# Patient Record
Sex: Female | Born: 1960 | Race: White | Hispanic: No | Marital: Married | Smoking: Never smoker
Health system: Southern US, Community
[De-identification: ages and names within clinical notes are randomized; demographics above are authoritative.]

## PROBLEM LIST (undated history)

## (undated) DIAGNOSIS — I10 Essential (primary) hypertension: Secondary | ICD-10-CM

## (undated) HISTORY — PX: ROTATOR CUFF REPAIR: SHX139

## (undated) HISTORY — PX: REDUCTION MAMMAPLASTY: SUR839

---

## 1998-06-18 ENCOUNTER — Other Ambulatory Visit: Admission: RE | Admit: 1998-06-18 | Discharge: 1998-06-18 | Payer: Self-pay | Admitting: Gynecology

## 1999-08-18 ENCOUNTER — Other Ambulatory Visit: Admission: RE | Admit: 1999-08-18 | Discharge: 1999-08-18 | Payer: Self-pay | Admitting: Gynecology

## 2000-12-09 ENCOUNTER — Other Ambulatory Visit: Admission: RE | Admit: 2000-12-09 | Discharge: 2000-12-09 | Payer: Self-pay | Admitting: Gynecology

## 2002-12-13 ENCOUNTER — Other Ambulatory Visit: Admission: RE | Admit: 2002-12-13 | Discharge: 2002-12-13 | Payer: Self-pay | Admitting: Gynecology

## 2004-04-16 ENCOUNTER — Other Ambulatory Visit: Admission: RE | Admit: 2004-04-16 | Discharge: 2004-04-16 | Payer: Self-pay | Admitting: Gynecology

## 2005-07-08 ENCOUNTER — Other Ambulatory Visit: Admission: RE | Admit: 2005-07-08 | Discharge: 2005-07-08 | Payer: Self-pay | Admitting: Gynecology

## 2006-11-02 ENCOUNTER — Other Ambulatory Visit: Admission: RE | Admit: 2006-11-02 | Discharge: 2006-11-02 | Payer: Self-pay | Admitting: Gynecology

## 2009-08-12 ENCOUNTER — Encounter: Payer: Self-pay | Admitting: Pulmonary Disease

## 2009-09-03 ENCOUNTER — Encounter: Admission: RE | Admit: 2009-09-03 | Discharge: 2009-09-03 | Payer: Self-pay | Admitting: Otolaryngology

## 2010-08-03 ENCOUNTER — Emergency Department (HOSPITAL_BASED_OUTPATIENT_CLINIC_OR_DEPARTMENT_OTHER): Admission: EM | Admit: 2010-08-03 | Discharge: 2010-08-03 | Payer: Self-pay | Admitting: Emergency Medicine

## 2014-01-26 ENCOUNTER — Ambulatory Visit: Payer: Self-pay

## 2014-08-20 ENCOUNTER — Other Ambulatory Visit: Payer: Self-pay | Admitting: Otolaryngology

## 2014-08-20 DIAGNOSIS — R51 Headache: Principal | ICD-10-CM

## 2014-08-20 DIAGNOSIS — R519 Headache, unspecified: Secondary | ICD-10-CM

## 2014-08-30 ENCOUNTER — Ambulatory Visit
Admission: RE | Admit: 2014-08-30 | Discharge: 2014-08-30 | Disposition: A | Discharge status: Home / Self Care | Payer: BC Managed Care – PPO | Source: Ambulatory Visit | Attending: Otolaryngology | Admitting: Otolaryngology

## 2014-08-30 DIAGNOSIS — R51 Headache: Principal | ICD-10-CM

## 2014-08-30 DIAGNOSIS — R519 Headache, unspecified: Secondary | ICD-10-CM

## 2014-08-30 MED ORDER — GADOBENATE DIMEGLUMINE 529 MG/ML IV SOLN
15.0000 mL | Freq: Once | INTRAVENOUS | Status: AC | PRN
  Administered 2014-08-30: 15 mL via INTRAVENOUS

## 2016-03-20 ENCOUNTER — Other Ambulatory Visit: Payer: Self-pay | Admitting: Orthopedic Surgery

## 2016-03-20 DIAGNOSIS — R52 Pain, unspecified: Secondary | ICD-10-CM

## 2016-03-20 DIAGNOSIS — R531 Weakness: Secondary | ICD-10-CM

## 2016-03-24 ENCOUNTER — Ambulatory Visit
Admission: RE | Admit: 2016-03-24 | Discharge: 2016-03-24 | Disposition: A | Discharge status: Home / Self Care | Payer: BLUE CROSS/BLUE SHIELD | Source: Ambulatory Visit | Attending: Orthopedic Surgery | Admitting: Orthopedic Surgery

## 2016-03-24 DIAGNOSIS — R52 Pain, unspecified: Secondary | ICD-10-CM

## 2016-03-24 DIAGNOSIS — R531 Weakness: Secondary | ICD-10-CM

## 2016-03-26 DIAGNOSIS — G8929 Other chronic pain: Secondary | ICD-10-CM | POA: Insufficient documentation

## 2016-03-26 DIAGNOSIS — M25562 Pain in left knee: Secondary | ICD-10-CM

## 2016-03-26 DIAGNOSIS — M25561 Pain in right knee: Secondary | ICD-10-CM

## 2016-06-23 DIAGNOSIS — Z9889 Other specified postprocedural states: Secondary | ICD-10-CM | POA: Insufficient documentation

## 2016-07-28 ENCOUNTER — Other Ambulatory Visit: Payer: Self-pay | Admitting: Gastroenterology

## 2016-07-28 DIAGNOSIS — R1033 Periumbilical pain: Secondary | ICD-10-CM

## 2016-07-31 ENCOUNTER — Ambulatory Visit
Admission: RE | Admit: 2016-07-31 | Discharge: 2016-07-31 | Disposition: A | Discharge status: Home / Self Care | Payer: BLUE CROSS/BLUE SHIELD | Source: Ambulatory Visit | Attending: Gastroenterology | Admitting: Gastroenterology

## 2016-07-31 DIAGNOSIS — R1033 Periumbilical pain: Secondary | ICD-10-CM

## 2016-07-31 MED ORDER — IOPAMIDOL (ISOVUE-300) INJECTION 61%
100.0000 mL | Freq: Once | INTRAVENOUS | Status: AC | PRN
  Administered 2016-07-31: 100 mL via INTRAVENOUS

## 2017-01-14 DIAGNOSIS — J0100 Acute maxillary sinusitis, unspecified: Secondary | ICD-10-CM | POA: Diagnosis not present

## 2017-02-02 DIAGNOSIS — R05 Cough: Secondary | ICD-10-CM | POA: Diagnosis not present

## 2017-03-11 DIAGNOSIS — M2042 Other hammer toe(s) (acquired), left foot: Secondary | ICD-10-CM | POA: Diagnosis not present

## 2017-03-11 DIAGNOSIS — M2040 Other hammer toe(s) (acquired), unspecified foot: Secondary | ICD-10-CM | POA: Diagnosis not present

## 2017-03-11 DIAGNOSIS — M2041 Other hammer toe(s) (acquired), right foot: Secondary | ICD-10-CM | POA: Diagnosis not present

## 2017-03-11 DIAGNOSIS — M19071 Primary osteoarthritis, right ankle and foot: Secondary | ICD-10-CM | POA: Diagnosis not present

## 2017-03-11 DIAGNOSIS — G5762 Lesion of plantar nerve, left lower limb: Secondary | ICD-10-CM | POA: Diagnosis not present

## 2017-08-17 ENCOUNTER — Encounter: Payer: Self-pay | Admitting: Endocrinology

## 2017-08-17 DIAGNOSIS — Z1389 Encounter for screening for other disorder: Secondary | ICD-10-CM | POA: Diagnosis not present

## 2017-08-17 DIAGNOSIS — E559 Vitamin D deficiency, unspecified: Secondary | ICD-10-CM | POA: Diagnosis not present

## 2017-08-17 DIAGNOSIS — Z0000 Encounter for general adult medical examination without abnormal findings: Secondary | ICD-10-CM | POA: Diagnosis not present

## 2017-08-17 DIAGNOSIS — I10 Essential (primary) hypertension: Secondary | ICD-10-CM | POA: Diagnosis not present

## 2017-08-17 DIAGNOSIS — E7849 Other hyperlipidemia: Secondary | ICD-10-CM | POA: Diagnosis not present

## 2017-09-30 DIAGNOSIS — Z6832 Body mass index (BMI) 32.0-32.9, adult: Secondary | ICD-10-CM | POA: Diagnosis not present

## 2017-09-30 DIAGNOSIS — I10 Essential (primary) hypertension: Secondary | ICD-10-CM | POA: Diagnosis not present

## 2017-09-30 DIAGNOSIS — J45998 Other asthma: Secondary | ICD-10-CM | POA: Diagnosis not present

## 2017-09-30 DIAGNOSIS — R05 Cough: Secondary | ICD-10-CM | POA: Diagnosis not present

## 2017-11-08 DIAGNOSIS — L700 Acne vulgaris: Secondary | ICD-10-CM | POA: Diagnosis not present

## 2017-11-08 DIAGNOSIS — L218 Other seborrheic dermatitis: Secondary | ICD-10-CM | POA: Diagnosis not present

## 2017-11-08 DIAGNOSIS — L820 Inflamed seborrheic keratosis: Secondary | ICD-10-CM | POA: Diagnosis not present

## 2018-01-10 DIAGNOSIS — E669 Obesity, unspecified: Secondary | ICD-10-CM | POA: Diagnosis not present

## 2018-01-10 DIAGNOSIS — Z6833 Body mass index (BMI) 33.0-33.9, adult: Secondary | ICD-10-CM | POA: Diagnosis not present

## 2018-02-16 DIAGNOSIS — Z1231 Encounter for screening mammogram for malignant neoplasm of breast: Secondary | ICD-10-CM | POA: Diagnosis not present

## 2018-02-16 DIAGNOSIS — Z13820 Encounter for screening for osteoporosis: Secondary | ICD-10-CM | POA: Diagnosis not present

## 2018-02-16 DIAGNOSIS — Z6831 Body mass index (BMI) 31.0-31.9, adult: Secondary | ICD-10-CM | POA: Diagnosis not present

## 2018-02-16 DIAGNOSIS — Z808 Family history of malignant neoplasm of other organs or systems: Secondary | ICD-10-CM | POA: Diagnosis not present

## 2018-02-16 DIAGNOSIS — Z01419 Encounter for gynecological examination (general) (routine) without abnormal findings: Secondary | ICD-10-CM | POA: Diagnosis not present

## 2018-02-16 DIAGNOSIS — Z800 Family history of malignant neoplasm of digestive organs: Secondary | ICD-10-CM | POA: Diagnosis not present

## 2018-04-11 DIAGNOSIS — Z809 Family history of malignant neoplasm, unspecified: Secondary | ICD-10-CM | POA: Diagnosis not present

## 2018-07-14 DIAGNOSIS — G8929 Other chronic pain: Secondary | ICD-10-CM | POA: Diagnosis not present

## 2018-07-14 DIAGNOSIS — M172 Bilateral post-traumatic osteoarthritis of knee: Secondary | ICD-10-CM | POA: Diagnosis not present

## 2018-07-25 DIAGNOSIS — M172 Bilateral post-traumatic osteoarthritis of knee: Secondary | ICD-10-CM | POA: Diagnosis not present

## 2018-07-25 DIAGNOSIS — G8929 Other chronic pain: Secondary | ICD-10-CM | POA: Diagnosis not present

## 2018-11-07 DIAGNOSIS — E7849 Other hyperlipidemia: Secondary | ICD-10-CM | POA: Diagnosis not present

## 2018-11-07 DIAGNOSIS — J45998 Other asthma: Secondary | ICD-10-CM | POA: Diagnosis not present

## 2018-11-07 DIAGNOSIS — E559 Vitamin D deficiency, unspecified: Secondary | ICD-10-CM | POA: Diagnosis not present

## 2018-11-07 DIAGNOSIS — G4700 Insomnia, unspecified: Secondary | ICD-10-CM | POA: Diagnosis not present

## 2018-11-07 DIAGNOSIS — M25561 Pain in right knee: Secondary | ICD-10-CM | POA: Diagnosis not present

## 2018-11-07 DIAGNOSIS — I10 Essential (primary) hypertension: Secondary | ICD-10-CM | POA: Diagnosis not present

## 2019-03-09 DIAGNOSIS — M5416 Radiculopathy, lumbar region: Secondary | ICD-10-CM | POA: Diagnosis not present

## 2019-03-09 DIAGNOSIS — M5412 Radiculopathy, cervical region: Secondary | ICD-10-CM | POA: Diagnosis not present

## 2019-03-09 DIAGNOSIS — M545 Low back pain: Secondary | ICD-10-CM | POA: Diagnosis not present

## 2019-03-09 DIAGNOSIS — M542 Cervicalgia: Secondary | ICD-10-CM | POA: Diagnosis not present

## 2019-03-10 DIAGNOSIS — M545 Low back pain: Secondary | ICD-10-CM | POA: Diagnosis not present

## 2019-03-10 DIAGNOSIS — M542 Cervicalgia: Secondary | ICD-10-CM | POA: Diagnosis not present

## 2019-03-23 ENCOUNTER — Telehealth: Payer: Self-pay | Admitting: Plastic Surgery

## 2019-03-23 NOTE — Telephone Encounter (Signed)

## 2019-03-24 ENCOUNTER — Encounter: Payer: Self-pay | Admitting: Plastic Surgery

## 2019-03-24 ENCOUNTER — Ambulatory Visit: Payer: BC Managed Care – PPO | Admitting: Plastic Surgery

## 2019-03-24 ENCOUNTER — Other Ambulatory Visit: Payer: Self-pay

## 2019-03-24 DIAGNOSIS — M542 Cervicalgia: Secondary | ICD-10-CM | POA: Insufficient documentation

## 2019-03-24 DIAGNOSIS — M546 Pain in thoracic spine: Secondary | ICD-10-CM

## 2019-03-24 DIAGNOSIS — N62 Hypertrophy of breast: Secondary | ICD-10-CM | POA: Diagnosis not present

## 2019-03-24 DIAGNOSIS — G8929 Other chronic pain: Secondary | ICD-10-CM | POA: Diagnosis not present

## 2019-03-24 DIAGNOSIS — M549 Dorsalgia, unspecified: Secondary | ICD-10-CM | POA: Insufficient documentation

## 2019-03-24 NOTE — Progress Notes (Signed)
Patient ID: Stephanie Hall, female    DOB: February 01, 1961, 57 y.o.   MRN: [id number]   Chief Complaint  Patient presents with  . Advice Only    for breast reduction    Mammary Hyperplasia: The patient is a 58 y.o. female with a history of mammary hyperplasia for several years.  She has extremely large breasts causing symptoms that include the following: Back pain (upper and lower) and neck pain. She frequently pins bra cups higher on straps for better lift and relief. Notices relief when holding breast up in her hands. Shoulder straps causing grooves, pain occasionally requiring padding. Pain medication is sometimes required with motrin and tylenol.  Activities that are hindered by enlarged breasts include: Golf, running and exercise.  Her breasts are extremely large and fairly symmetric.  She has hyperpigmentation of the inframammary area on both sides.  The sternal to nipple distance on the right is 26 cm and the left is 28 cm.  The IMF distance is 14 cm.  She is 5 feet 4 inches tall and weighs 186 pounds.  Preoperative bra size = 38 DDD cup.  The estimated excess breast tissue to be removed at the time of surgery = 450 grams on the left and 450 grams on the right.  The patient has had anesthesia or sedation in the past.   The patient has not had problems with anesthesia  She doesnot have a history of breast feeding. She does not have a family history of breast cancer.   Her other medical history is remarkable for hyperlipidemia. Her surgical history is remarkable for shoulder surgery and hysterectomy. She has had a previous mammogram.  She is due for a mammogram now and will go ahead and make arrangements to have it done.     Review of Systems  Constitutional: Positive for activity change. Negative for appetite change.  HENT: Negative.   Eyes: Negative.   Respiratory: Negative.  Negative for chest tightness and shortness of breath.   Cardiovascular: Negative.  Negative for leg  swelling.  Gastrointestinal: Negative.  Negative for abdominal pain.  Genitourinary: Negative.   Musculoskeletal: Positive for back pain and neck pain.  Neurological: Negative.   Hematological: Negative.   Psychiatric/Behavioral: Negative.     History reviewed. No pertinent past medical history.  History reviewed. No pertinent surgical history.    Current Outpatient Medications:  .  Diclofenac-miSOPROStol 75-0.2 MG TBEC, diclofenac 75 mg-misoprostol 200 mcg tablet,immediate,delayed release, Disp: , Rfl:  .  DULoxetine (CYMBALTA) 30 MG capsule, Take 30 mg by mouth daily., Disp: , Rfl:  .  methocarbamol (ROBAXIN) 500 MG tablet, As needed., Disp: , Rfl:  .  metoprolol succinate (TOPROL-XL) 50 MG 24 hr tablet, Take 1 tablet by mouth 2 (two) times daily., Disp: , Rfl:  .  rizatriptan (MAXALT-MLT) 10 MG disintegrating tablet, As needed., Disp: , Rfl:  .  traZODone (DESYREL) 50 MG tablet, trazodone 50 mg tablet, Disp: , Rfl:    Objective:   Vitals:   03/24/19 1537  BP: (!) 156/85  Pulse: 76  Temp: 98.2 F (36.8 C)  SpO2: 95%    Physical Exam Vitals signs and nursing note reviewed.  Constitutional:      Appearance: Normal appearance.  HENT:     Head: Normocephalic and atraumatic.     Nose: Nose normal.     Mouth/Throat:     Mouth: Mucous membranes are moist.  Eyes:     Pupils: Pupils are equal, round, and reactive  to light.  Neck:     Musculoskeletal: Muscular tenderness present.  Cardiovascular:     Rate and Rhythm: Normal rate.     Pulses: Normal pulses.  Abdominal:     General: Abdomen is flat. There is no distension.     Tenderness: There is no abdominal tenderness.  Musculoskeletal:        General: No swelling or deformity.  Skin:    General: Skin is warm.     Capillary Refill: Capillary refill takes less than 2 seconds.  Neurological:     General: No focal deficit present.     Mental Status: She is alert and oriented to person, place, and time.  Psychiatric:         Mood and Affect: Mood normal.        Behavior: Behavior normal.        Thought Content: Thought content normal.     Assessment & Plan:     ICD-10-CM   1. Neck pain  M54.2   2. Chronic bilateral thoracic back pain  M54.6    G89.29   3. Symptomatic mammary hypertrophy  N62     Recommend bilateral breast reduction with lateral liposuction.  Pictures taken with patient permission and placed in the chart.  Alena Billslaire S Manasvi Dickard, DO

## 2019-03-27 DIAGNOSIS — R0989 Other specified symptoms and signs involving the circulatory and respiratory systems: Secondary | ICD-10-CM | POA: Diagnosis not present

## 2019-03-27 DIAGNOSIS — K219 Gastro-esophageal reflux disease without esophagitis: Secondary | ICD-10-CM | POA: Insufficient documentation

## 2019-03-27 DIAGNOSIS — R198 Other specified symptoms and signs involving the digestive system and abdomen: Secondary | ICD-10-CM | POA: Insufficient documentation

## 2019-03-27 DIAGNOSIS — H9313 Tinnitus, bilateral: Secondary | ICD-10-CM | POA: Insufficient documentation

## 2019-03-27 DIAGNOSIS — R490 Dysphonia: Secondary | ICD-10-CM | POA: Diagnosis not present

## 2019-04-07 DIAGNOSIS — M542 Cervicalgia: Secondary | ICD-10-CM | POA: Diagnosis not present

## 2019-04-07 DIAGNOSIS — M5417 Radiculopathy, lumbosacral region: Secondary | ICD-10-CM | POA: Diagnosis not present

## 2019-04-07 DIAGNOSIS — M5412 Radiculopathy, cervical region: Secondary | ICD-10-CM | POA: Diagnosis not present

## 2019-04-07 DIAGNOSIS — M4725 Other spondylosis with radiculopathy, thoracolumbar region: Secondary | ICD-10-CM | POA: Diagnosis not present

## 2019-04-13 DIAGNOSIS — Z1231 Encounter for screening mammogram for malignant neoplasm of breast: Secondary | ICD-10-CM | POA: Diagnosis not present

## 2019-05-11 DIAGNOSIS — G8929 Other chronic pain: Secondary | ICD-10-CM | POA: Diagnosis not present

## 2019-05-11 DIAGNOSIS — M25512 Pain in left shoulder: Secondary | ICD-10-CM | POA: Diagnosis not present

## 2019-05-23 ENCOUNTER — Other Ambulatory Visit: Payer: Self-pay

## 2019-05-23 ENCOUNTER — Encounter: Payer: Self-pay | Admitting: Surgical

## 2019-05-23 ENCOUNTER — Ambulatory Visit (INDEPENDENT_AMBULATORY_CARE_PROVIDER_SITE_OTHER): Payer: BC Managed Care – PPO | Admitting: Surgical

## 2019-05-23 VITALS — BP 128/81 | HR 68 | Temp 97.1°F | Ht 65.0 in | Wt 183.0 lb

## 2019-05-23 DIAGNOSIS — N62 Hypertrophy of breast: Secondary | ICD-10-CM

## 2019-05-23 DIAGNOSIS — M546 Pain in thoracic spine: Secondary | ICD-10-CM

## 2019-05-23 DIAGNOSIS — M542 Cervicalgia: Secondary | ICD-10-CM

## 2019-05-23 DIAGNOSIS — G8929 Other chronic pain: Secondary | ICD-10-CM

## 2019-05-23 MED ORDER — HYDROCODONE-ACETAMINOPHEN 5-325 MG PO TABS: 1.0000 | ORAL_TABLET | Freq: Four times a day (QID) | ORAL | 0 refills | Status: AC | PRN

## 2019-05-23 MED ORDER — ONDANSETRON HCL 4 MG PO TABS: 4.0000 mg | ORAL_TABLET | Freq: Three times a day (TID) | ORAL | 0 refills | Status: DC | PRN

## 2019-05-23 MED ORDER — CEPHALEXIN 500 MG PO CAPS: 500.0000 mg | ORAL_CAPSULE | Freq: Two times a day (BID) | ORAL | 0 refills | Status: AC

## 2019-05-23 NOTE — Progress Notes (Signed)
Patient ID: Stephanie Hall, female    DOB: 04-06-1961, 58 y.o.   MRN: [id number]  Chief Complaint  Patient presents with  . Pre-op Exam    for (B) breast reduction with lipo      ICD-10-CM   1. Symptomatic mammary hypertrophy  N62   2. Chronic bilateral thoracic back pain  M54.6    G89.29   3. Neck pain  M54.2      History of Present Illness: Stephanie Hall is a 58 y.o.  female  with a history of mammary hypertrophy.  She presents for preoperative evaluation for upcoming procedure, bilateral breast reduction with liposuction and possible liposuction of the abdomen and flanks, scheduled for 06/06/19 with Dr. Ulice [facility].  The sternal to nipple distance on the right is 26 cm and the left is 28 cm.  The IMF distance is 14 cm.  She is 5 feet 4 inches tall and weighs 183 pounds.  Preoperative bra size = 38 DDD cup.  The estimated excess breast tissue to be removed at the time of surgery = 450 grams on the left and 450 grams on the right.   She would like to be smaller than expected. She would like to be approximately a C cup.  The patient has not had problems with anesthesia. Caprini score of 4. No hx of DVT/PE, no fmhx of breast cancer. Mammogram 2-3 weeks ago, patient reports was normal. Will request results from imaging center or have patient bring results.  No recent illnesses or colds. No fevers, chills. She does report some constipation that is chronic.  Past Medical History: Allergies: Allergies  Allergen Reactions  . Neosporin [Bacitracin-Polymyxin B] Hives and Rash    Current Medications:  Current Outpatient Medications:  .  B Complex Vitamins (VITAMIN B-COMPLEX) TABS, Take 1 tablet by mouth daily., Disp: , Rfl:  .  b complex vitamins tablet, Take 1 tablet by mouth daily., Disp: , Rfl:  .  Diclofenac-miSOPROStol 75-0.2 MG TBEC, diclofenac 75 mg-misoprostol 200 mcg tablet,immediate,delayed release, Disp: , Rfl:  .  DULoxetine (CYMBALTA) 30 MG capsule, Take 30 mg  by mouth daily., Disp: , Rfl:  .  methocarbamol (ROBAXIN) 500 MG tablet, As needed., Disp: , Rfl:  .  metoprolol succinate (TOPROL-XL) 50 MG 24 hr tablet, Take 1 tablet by mouth 2 (two) times daily., Disp: , Rfl:  .  omeprazole (PRILOSEC) 40 MG capsule, Take 1 capsule by mouth daily., Disp: , Rfl:  .  OVER THE COUNTER MEDICATION, Multivitamin-Take 1 table by mouth daily., Disp: , Rfl:  .  rizatriptan (MAXALT-MLT) 10 MG disintegrating tablet, As needed., Disp: , Rfl:  .  traZODone (DESYREL) 50 MG tablet, trazodone 50 mg tablet, Disp: , Rfl:   Past Medical Problems: No past medical history on file.  Past Surgical History: No past surgical history on file.  Social History: Social History   Socioeconomic History  . Marital status: Married    Spouse name: Not on file  . Number of children: Not on file  . Years of education: Not on file  . Highest education level: Not on file  Occupational History  . Not on file  Social Needs  . Financial resource strain: Not on file  . Food insecurity    Worry: Not on file    Inability: Not on file  . Transportation needs    Medical: Not on file    Non-medical: Not on file  Tobacco Use  . Smoking status: Never Smoker  .  Smokeless tobacco: Never Used  Substance and Sexual Activity  . Alcohol use: Not on file  . Drug use: Not on file  . Sexual activity: Not on file  Lifestyle  . Physical activity    Days per week: Not on file    Minutes per session: Not on file  . Stress: Not on file  Relationships  . Social Musicianconnections    Talks on phone: Not on file    Gets together: Not on file    Attends religious service: Not on file    Active member of club or organization: Not on file    Attends meetings of clubs or organizations: Not on file    Relationship status: Not on file  . Intimate partner violence    Fear of current or ex partner: Not on file    Emotionally abused: Not on file    Physically abused: Not on file    Forced sexual  activity: Not on file  Other Topics Concern  . Not on file  Social History Narrative  . Not on file    Family History: No family history on file.  Review of Systems: Review of Systems  Constitutional: Negative for chills, fever, malaise/fatigue and weight loss.  Respiratory: Negative.   Cardiovascular: Negative.   Gastrointestinal: Negative.   Genitourinary: Negative.   Musculoskeletal: Positive for back pain, myalgias and neck pain.  Skin: Negative.   Neurological: Negative.     Physical Exam: Vital Signs BP 128/81 (BP Location: Left Arm, Patient Position: Sitting, Cuff Size: Normal)   Pulse 68   Temp (!) 97.1 F (36.2 C) (Temporal)   Ht 5\' 5"  (1.651 m)   Wt 183 lb (83 kg)   SpO2 96%   BMI 30.45 kg/m   Physical Exam Exam conducted with a chaperone present.  Constitutional:      General: She is not in acute distress.    Appearance: Normal appearance. She is not ill-appearing.  HENT:     Head: Normocephalic and atraumatic.  Eyes:     Pupils: Pupils are equal, round Neck:     Musculoskeletal: Normal range of motion.  Cardiovascular:     Rate and Rhythm: Normal rate and regular rhythm.     Pulses: Normal pulses.     Heart sounds: Normal heart sounds. No murmur.  Pulmonary:     Effort: Pulmonary effort is normal. No respiratory distress.     Breath sounds: Normal breath sounds. No wheezing.  Abdominal:     General: Abdomen is flat. There is no distension.     Palpations: Abdomen is soft.     Tenderness: There is no abdominal tenderness.  Musculoskeletal: Normal range of motion.  Skin:    General: Skin is warm and dry.     Findings: No erythema or rash.  Neurological:     General: No focal deficit present.     Mental Status: She is alert and oriented to person, place, and time. Mental status is at baseline.     Motor: No weakness.  Psychiatric:        Mood and Affect: Mood normal.        Behavior: Behavior normal.    Assessment/Plan: Stephanie Hall is  scheduled for bilateral breast reduction with lipo and possible liposuction of her abdomen and flanks with Dr. Ulice Boldillingham.  Risks, benefits, and alternatives of procedure discussed, questions answered and consent obtained.    The risk that can be encountered with breast reduction were discussed and include the  following but not limited to these:  Breast asymmetry, fluid accumulation, firmness of the breast, inability to breast feed, loss of nipple or areola, skin loss, decrease or no nipple sensation, fat necrosis of the breast tissue, bleeding, infection, healing delay.  There are risks of anesthesia, changes to skin sensation and injury to nerves or blood vessels.  The muscle can be temporarily or permanently injured.  You may have an allergic reaction to tape, suture, glue, blood products which can result in skin discoloration, swelling, pain, skin lesions, poor healing.  Any of these can lead to the need for revisonal surgery or stage procedures.  A reduction has potential to interfere with diagnostic procedures.  Nipple or breast piercing can increase risks of infection.  This procedure is best done when the breast is fully developed.  Changes in the breast will continue to occur over time.  Pregnancy can alter the outcomes of previous breast reduction surgery, weight gain and weigh loss can also effect the long term appearance.   The risks that can be encountered with and after liposuction were discussed and include the following but no limited to these:  Asymmetry, fluid accumulation, firmness of the area, fat necrosis with death of fat tissue, bleeding, infection, delayed healing, anesthesia risks, skin sensation changes, injury to structures including nerves, blood vessels, and muscles which may be temporary or permanent, allergies to tape, suture materials and glues, blood products, topical preparations or injected agents, skin and contour irregularities, skin discoloration and swelling, deep vein  thrombosis, cardiac and pulmonary complications, pain, which may persist, persistent pain, recurrence of the lesion, poor healing of the incision, possible need for revisional surgery or staged procedures. Thiere can also be persistent swelling, poor wound healing, rippling or loose skin, worsening of cellulite, swelling, and thermal burn or heat injury from ultrasound with the ultrasound-assisted lipoplasty technique. Any change in weight fluctuations can alter the outcome.  Caprini score of 4 - SCDs during surgery. Up and out of bed post-operatively. Medications sent to pharmacy.   Electronically signed by: Carola Rhine Monica Zahler, PA-C 05/23/2019 3:05 PM

## 2019-06-06 ENCOUNTER — Encounter: Payer: Self-pay | Admitting: Plastic Surgery

## 2019-06-06 DIAGNOSIS — N642 Atrophy of breast: Secondary | ICD-10-CM | POA: Diagnosis not present

## 2019-06-06 DIAGNOSIS — M542 Cervicalgia: Secondary | ICD-10-CM | POA: Diagnosis not present

## 2019-06-06 DIAGNOSIS — Z411 Encounter for cosmetic surgery: Secondary | ICD-10-CM | POA: Diagnosis not present

## 2019-06-06 DIAGNOSIS — E881 Lipodystrophy, not elsewhere classified: Secondary | ICD-10-CM | POA: Diagnosis not present

## 2019-06-06 DIAGNOSIS — Z719 Counseling, unspecified: Secondary | ICD-10-CM

## 2019-06-06 DIAGNOSIS — M549 Dorsalgia, unspecified: Secondary | ICD-10-CM | POA: Diagnosis not present

## 2019-06-06 DIAGNOSIS — N6012 Diffuse cystic mastopathy of left breast: Secondary | ICD-10-CM | POA: Diagnosis not present

## 2019-06-06 DIAGNOSIS — N62 Hypertrophy of breast: Secondary | ICD-10-CM | POA: Diagnosis not present

## 2019-06-06 MED ORDER — DIAZEPAM 2 MG PO TABS: 2.0000 mg | ORAL_TABLET | Freq: Three times a day (TID) | ORAL | 0 refills | Status: AC | PRN

## 2019-06-06 NOTE — Addendum Note (Signed)
Addended by: Wallace Going on: 06/06/2019 01:46 PM   Modules accepted: Orders

## 2019-06-13 ENCOUNTER — Other Ambulatory Visit: Payer: Self-pay

## 2019-06-13 ENCOUNTER — Ambulatory Visit (INDEPENDENT_AMBULATORY_CARE_PROVIDER_SITE_OTHER): Payer: BC Managed Care – PPO | Admitting: Surgical

## 2019-06-13 ENCOUNTER — Encounter: Payer: Self-pay | Admitting: Surgical

## 2019-06-13 ENCOUNTER — Encounter: Payer: BC Managed Care – PPO | Admitting: Surgical

## 2019-06-13 VITALS — BP 127/76 | HR 75 | Temp 97.8°F | Ht 65.0 in | Wt 179.8 lb

## 2019-06-13 DIAGNOSIS — M542 Cervicalgia: Secondary | ICD-10-CM

## 2019-06-13 DIAGNOSIS — G8929 Other chronic pain: Secondary | ICD-10-CM

## 2019-06-13 DIAGNOSIS — M546 Pain in thoracic spine: Secondary | ICD-10-CM

## 2019-06-13 DIAGNOSIS — N62 Hypertrophy of breast: Secondary | ICD-10-CM

## 2019-06-13 NOTE — Progress Notes (Signed)
   Subjective:     Patient ID: Stephanie Hall, female    DOB: April 02, 1961, 58 y.o.   MRN: [id number]  Chief Complaint  Patient presents with  . Post-op Follow-up    for (B) breast reduction with liposuction    HPI: The patient is a 58 y.o. female here for follow-up after bilateral breast reduction with liposuction of abdomen/flanks by Dr. Marla Roe at [facility] on 06/06/19. She is POD7.  Stephanie Hall is doing great. She has had minimal drain output and has been wearing a sports bra and her abdominal binder with topifoam. Her bruising has improved over the past week or so and she reports the pain is minimal and adequately controlled.  No fevers,chills,n/v. She is pleased with the size. Neck and back pain improving.  Review of Systems  Constitutional: Negative.   Respiratory: Negative.   Cardiovascular: Negative.   Gastrointestinal: Negative.   Genitourinary: Negative.   Musculoskeletal: Negative for back pain and neck pain.  Skin: Negative.        + bruising  Neurological: Negative.      Objective:   Vital Signs BP 127/76 (BP Location: Left Arm, Patient Position: Sitting, Cuff Size: Normal)   Pulse 75   Temp 97.8 F (36.6 C) (Temporal)   Ht 5\' 5"  (1.651 m)   Wt 179 lb 12.8 oz (81.6 kg)   SpO2 93%   BMI 29.92 kg/m  Vital Signs and Nursing Note Reviewed  Physical Exam  Constitutional: She is oriented to person, place, and time and well-developed, well-nourished, and in no distress. No distress.  HENT:  Head: Normocephalic and atraumatic.  Cardiovascular: Normal rate.  Pulmonary/Chest: Effort normal.  Musculoskeletal: Normal range of motion.  Neurological: She is alert and oriented to person, place, and time. Gait normal.  Skin: Skin is warm and dry. No rash noted. She is not diaphoretic. No erythema. No pallor.     Psychiatric: Mood and affect normal.      Assessment/Plan:     ICD-10-CM   1. Symptomatic mammary hypertrophy  N62   2. Neck pain  M54.2   3. Chronic  bilateral thoracic back pain  M54.6    G89.29     Stephanie Hall is doing great. She is pleased with the outcome and is healing nicely. She has not had any concerns, or fever/chills/n/v.  Continue wearing topifoam and abdominal binder for a few weeks to decrease swelling.  Continue wearing sports bra daily and at night.  Healthy diet, plenty of water, multivitamin.  Follow up in 1 week.  Carola Rhine Blanchard Willhite, PA-C 06/13/2019, 12:03 PM

## 2019-06-17 ENCOUNTER — Other Ambulatory Visit (HOSPITAL_COMMUNITY): Payer: BC Managed Care – PPO

## 2019-06-20 ENCOUNTER — Ambulatory Visit (INDEPENDENT_AMBULATORY_CARE_PROVIDER_SITE_OTHER): Payer: BC Managed Care – PPO | Admitting: Plastic Surgery

## 2019-06-20 ENCOUNTER — Other Ambulatory Visit: Payer: Self-pay

## 2019-06-20 ENCOUNTER — Encounter: Payer: Self-pay | Admitting: Plastic Surgery

## 2019-06-20 VITALS — BP 131/88 | HR 63 | Temp 98.5°F

## 2019-06-20 DIAGNOSIS — N62 Hypertrophy of breast: Secondary | ICD-10-CM

## 2019-06-20 NOTE — Progress Notes (Signed)
The patient is a 58 year old female here for follow-up after undergoing a bilateral breast reduction and liposuction.  She has a little bit of bruising of the abdomen and medial breast.  She also has a little bit of fluid and tenderness on the right lateral breast area.  This was aspirated and we got 20 cc of serosanguineous fluid.  She is healing very nicely.  There is no sign of infection in any of the areas.  She is wearing a spanks and feels more comfortable with that.  She is also wearing her sports bra.  She should start massaging as able any firm places.  Continue healthy eating and increasing her protein.

## 2019-06-21 ENCOUNTER — Ambulatory Visit (HOSPITAL_BASED_OUTPATIENT_CLINIC_OR_DEPARTMENT_OTHER): Admit: 2019-06-21 | Payer: BC Managed Care – PPO | Admitting: Plastic Surgery

## 2019-06-21 ENCOUNTER — Encounter (HOSPITAL_BASED_OUTPATIENT_CLINIC_OR_DEPARTMENT_OTHER): Payer: Self-pay

## 2019-06-21 SURGERY — BREAST REDUCTION WITH LIPOSUCTION
Anesthesia: General | Site: Breast | Laterality: Bilateral

## 2019-06-27 ENCOUNTER — Encounter: Payer: BC Managed Care – PPO | Admitting: Surgical

## 2019-07-04 ENCOUNTER — Encounter: Payer: Self-pay | Admitting: Surgical

## 2019-07-04 ENCOUNTER — Other Ambulatory Visit: Payer: Self-pay

## 2019-07-04 ENCOUNTER — Ambulatory Visit (INDEPENDENT_AMBULATORY_CARE_PROVIDER_SITE_OTHER): Payer: BC Managed Care – PPO | Admitting: Surgical

## 2019-07-04 VITALS — BP 132/85 | HR 69 | Temp 97.5°F | Ht 65.0 in | Wt 178.4 lb

## 2019-07-04 DIAGNOSIS — M542 Cervicalgia: Secondary | ICD-10-CM

## 2019-07-04 DIAGNOSIS — N62 Hypertrophy of breast: Secondary | ICD-10-CM

## 2019-07-04 DIAGNOSIS — M546 Pain in thoracic spine: Secondary | ICD-10-CM

## 2019-07-04 DIAGNOSIS — G8929 Other chronic pain: Secondary | ICD-10-CM

## 2019-07-04 NOTE — Progress Notes (Addendum)
   Subjective:     Patient ID: Stephanie Hall, female    DOB: 20-Jan-1961, 57 y.o.   MRN: [id number]  Chief Complaint  Patient presents with  . Follow-up    2 weeks for (B) breast reduction w/liposuction    HPI: The patient is a 58 y.o. female here for follow-up after bilateral breast reduction and liposuction on 06/06/2019.  At her last visit 20 cc of serosanguineous fluid was aspirated from her right lateral breast area.  Today she reports that she is doing really well. Her bruising has resolved and she has had minimal pain.  Her incisions are healing really well, they are all C/d/i.  Continues to wear spanx for the entire day and has been wearing a sports bra.   No fever, chills, n/v.  Review of Systems  Constitutional: Negative for chills, diaphoresis, fever, malaise/fatigue and weight loss.  Respiratory: Negative.   Cardiovascular: Negative.   Gastrointestinal: Positive for abdominal pain (tenderness).  Genitourinary: Negative.   Musculoskeletal: Negative.   Skin: Negative for itching and rash.  Neurological: Positive for sensory change.  Psychiatric/Behavioral: Negative.      Objective:   Vital Signs BP 132/85 (BP Location: Left Arm, Patient Position: Sitting, Cuff Size: Normal)   Pulse 69   Temp (!) 97.5 F (36.4 C) (Temporal)   Ht 5\' 5"  (1.651 m)   Wt 178 lb 6.4 oz (80.9 kg)   SpO2 94%   BMI 29.69 kg/m  Vital Signs and Nursing Note Reviewed Chaperone present Physical Exam  Constitutional: She is oriented to person, place, and time and well-developed, well-nourished, and in no distress.  HENT:  Head: Normocephalic and atraumatic.  Cardiovascular: Normal rate.  Pulmonary/Chest: Effort normal.  Musculoskeletal: Normal range of motion.  Neurological: She is alert and oriented to person, place, and time. Gait normal.  Skin: Skin is warm and dry. No rash noted. She is not diaphoretic. No erythema. No pallor.     Psychiatric: Mood and affect normal.       Assessment/Plan:     ICD-10-CM   1. Symptomatic mammary hypertrophy  N62   2. Chronic bilateral thoracic back pain  M54.6    G89.29   3. Neck pain  M54.2    Overall, she is doing really well.   She can continue wearing sports bra, stop wearing spanx 24/7 and can take them off for some time while at home.   Mederma cream can be used on incisions in 1 week.  Massage the area of hardness/fat necrosis with cocoa butter or non-scented lotion Multivitamin and well balanced diet for optimal healing. Liposuction sutures and breast reduction sutures removed today.  Follow up in 1 month  Pictures were obtained of the patient and placed in the chart with the patient's or guardian's permission.    Carola Rhine Brance Dartt, PA-C 07/04/2019, 3:01 PM

## 2019-07-18 ENCOUNTER — Other Ambulatory Visit: Payer: Self-pay | Admitting: Orthopedic Surgery

## 2019-07-18 DIAGNOSIS — M25512 Pain in left shoulder: Secondary | ICD-10-CM

## 2019-07-18 DIAGNOSIS — G8929 Other chronic pain: Secondary | ICD-10-CM

## 2019-07-24 DIAGNOSIS — K649 Unspecified hemorrhoids: Secondary | ICD-10-CM | POA: Diagnosis not present

## 2019-07-24 DIAGNOSIS — N952 Postmenopausal atrophic vaginitis: Secondary | ICD-10-CM | POA: Diagnosis not present

## 2019-07-24 DIAGNOSIS — Z01419 Encounter for gynecological examination (general) (routine) without abnormal findings: Secondary | ICD-10-CM | POA: Diagnosis not present

## 2019-07-24 DIAGNOSIS — Z6830 Body mass index (BMI) 30.0-30.9, adult: Secondary | ICD-10-CM | POA: Diagnosis not present

## 2019-07-27 ENCOUNTER — Ambulatory Visit
Admission: RE | Admit: 2019-07-27 | Discharge: 2019-07-27 | Disposition: A | Discharge status: Home / Self Care | Payer: BC Managed Care – PPO | Source: Ambulatory Visit | Attending: Orthopedic Surgery | Admitting: Orthopedic Surgery

## 2019-07-27 ENCOUNTER — Other Ambulatory Visit: Payer: Self-pay

## 2019-07-27 DIAGNOSIS — G8929 Other chronic pain: Secondary | ICD-10-CM

## 2019-07-27 DIAGNOSIS — M25512 Pain in left shoulder: Secondary | ICD-10-CM

## 2019-07-27 DIAGNOSIS — M75122 Complete rotator cuff tear or rupture of left shoulder, not specified as traumatic: Secondary | ICD-10-CM | POA: Diagnosis not present

## 2019-07-31 DIAGNOSIS — H903 Sensorineural hearing loss, bilateral: Secondary | ICD-10-CM | POA: Insufficient documentation

## 2019-08-01 ENCOUNTER — Ambulatory Visit: Payer: BC Managed Care – PPO | Admitting: Surgical

## 2019-08-01 DIAGNOSIS — G8929 Other chronic pain: Secondary | ICD-10-CM | POA: Diagnosis not present

## 2019-08-01 DIAGNOSIS — M25512 Pain in left shoulder: Secondary | ICD-10-CM | POA: Diagnosis not present

## 2019-08-03 DIAGNOSIS — H9313 Tinnitus, bilateral: Secondary | ICD-10-CM | POA: Diagnosis not present

## 2019-08-03 DIAGNOSIS — H938X3 Other specified disorders of ear, bilateral: Secondary | ICD-10-CM | POA: Diagnosis not present

## 2019-08-03 DIAGNOSIS — H903 Sensorineural hearing loss, bilateral: Secondary | ICD-10-CM | POA: Diagnosis not present

## 2019-08-03 DIAGNOSIS — M75112 Incomplete rotator cuff tear or rupture of left shoulder, not specified as traumatic: Secondary | ICD-10-CM | POA: Insufficient documentation

## 2019-08-15 ENCOUNTER — Encounter: Payer: Self-pay | Admitting: Surgical

## 2019-08-15 ENCOUNTER — Ambulatory Visit (INDEPENDENT_AMBULATORY_CARE_PROVIDER_SITE_OTHER): Payer: BC Managed Care – PPO | Admitting: Surgical

## 2019-08-15 ENCOUNTER — Other Ambulatory Visit: Payer: Self-pay

## 2019-08-15 VITALS — BP 149/82 | HR 70 | Temp 97.5°F | Ht 65.0 in | Wt 177.0 lb

## 2019-08-15 DIAGNOSIS — M546 Pain in thoracic spine: Secondary | ICD-10-CM

## 2019-08-15 DIAGNOSIS — M542 Cervicalgia: Secondary | ICD-10-CM

## 2019-08-15 DIAGNOSIS — G8929 Other chronic pain: Secondary | ICD-10-CM

## 2019-08-15 DIAGNOSIS — N62 Hypertrophy of breast: Secondary | ICD-10-CM

## 2019-08-15 NOTE — Progress Notes (Signed)
   Subjective:     Patient ID: Stephanie Hall, female    DOB: May 03, 1961, 58 y.o.   MRN: [id number]  Chief Complaint  Patient presents with  . Follow-up    1 mos    HPI: The patient is a 58 y.o. female here for follow-up after bilateral breast reduction and liposuction on 06/06/2019.  She is doing well. No sign of fluid collection/seroma/hematoma at this time in the R breast. She has healed really well. Incisions c/d/i. She has been applying a biotin cream/ointment.  Denies any fever, chills, n/v. She recently injured her L arm and is scheduled for rotator cuff surgery in December.  She is still wearing sports bra. She has some excess breast tissue at the lateral aspect of her bilateral breasts that bothers her when wearing her bra. She also noted a small area superior to her belly button that is more pronounced than the rest of her abdomen. She is interested in possible revision for this.   Review of Systems  Constitutional: Positive for activity change. Negative for appetite change, chills, diaphoresis, fatigue and fever.  Respiratory: Negative.   Cardiovascular: Negative for chest pain.  Gastrointestinal: Negative for diarrhea, nausea and vomiting.  Musculoskeletal: Negative for myalgias.  Skin: Negative for color change, pallor, rash and wound.  Neurological: Negative for dizziness and weakness.   Objective:   Vital Signs BP (!) 149/82 (BP Location: Left Arm, Patient Position: Sitting, Cuff Size: Normal)   Pulse 70   Temp (!) 97.5 F (36.4 C) (Temporal)   Ht 5\' 5"  (1.651 m)   Wt 177 lb (80.3 kg)   SpO2 98%   BMI 29.45 kg/m  Vital Signs and Nursing Note Reviewed  Physical Exam  Constitutional: She is oriented to person, place, and time and well-developed, well-nourished, and in no distress.  HENT:  Head: Normocephalic and atraumatic.  Cardiovascular: Normal rate.  Pulmonary/Chest: Effort normal.    Abdominal:    Musculoskeletal: Normal range of motion.   Neurological: She is alert and oriented to person, place, and time. Gait normal.  Skin: Skin is warm and dry. No rash noted. She is not diaphoretic. No erythema. No pallor.  Psychiatric: Mood and affect normal.      Assessment/Plan:     ICD-10-CM   1. Symptomatic mammary hypertrophy  N62   2. Chronic bilateral thoracic back pain  M54.6    G89.29   3. Neck pain  M54.2     Stephanie Hall is healing really well. Incisions along bilateral breasts s/p reduction are healing well. Abdominal lipo incisions healing well.   She has two areas she was interested in a possible revision for: Midline abdomen superior to naval and the lateral aspect of bilateral breasts. Patient to follow up in 4 months for re-evaluation. Allow at least 6 months for swelling and healing. Continue to massage areas of fat necrosis.  Continue with sports bra 1 more month then transition into bra without underwire.  Follow up in 4 months. Call with questions or concerns.  Carola Rhine Thoren Hosang, PA-C 08/15/2019, 4:02 PM

## 2019-09-13 DIAGNOSIS — M24112 Other articular cartilage disorders, left shoulder: Secondary | ICD-10-CM | POA: Diagnosis not present

## 2019-09-13 DIAGNOSIS — M94212 Chondromalacia, left shoulder: Secondary | ICD-10-CM | POA: Diagnosis not present

## 2019-09-13 DIAGNOSIS — G8918 Other acute postprocedural pain: Secondary | ICD-10-CM | POA: Diagnosis not present

## 2019-09-13 DIAGNOSIS — M7542 Impingement syndrome of left shoulder: Secondary | ICD-10-CM | POA: Diagnosis not present

## 2019-09-13 DIAGNOSIS — S43432A Superior glenoid labrum lesion of left shoulder, initial encounter: Secondary | ICD-10-CM | POA: Diagnosis not present

## 2019-09-13 DIAGNOSIS — X58XXXA Exposure to other specified factors, initial encounter: Secondary | ICD-10-CM | POA: Diagnosis not present

## 2019-09-13 DIAGNOSIS — S46012A Strain of muscle(s) and tendon(s) of the rotator cuff of left shoulder, initial encounter: Secondary | ICD-10-CM | POA: Diagnosis not present

## 2019-09-13 DIAGNOSIS — Y999 Unspecified external cause status: Secondary | ICD-10-CM | POA: Diagnosis not present

## 2019-09-18 DIAGNOSIS — Z7409 Other reduced mobility: Secondary | ICD-10-CM | POA: Diagnosis not present

## 2019-09-18 DIAGNOSIS — M25512 Pain in left shoulder: Secondary | ICD-10-CM | POA: Diagnosis not present

## 2019-09-18 DIAGNOSIS — Z9889 Other specified postprocedural states: Secondary | ICD-10-CM | POA: Diagnosis not present

## 2019-09-18 DIAGNOSIS — M25612 Stiffness of left shoulder, not elsewhere classified: Secondary | ICD-10-CM | POA: Diagnosis not present

## 2019-09-19 DIAGNOSIS — K219 Gastro-esophageal reflux disease without esophagitis: Secondary | ICD-10-CM | POA: Diagnosis not present

## 2019-09-19 DIAGNOSIS — K625 Hemorrhage of anus and rectum: Secondary | ICD-10-CM | POA: Diagnosis not present

## 2019-09-19 DIAGNOSIS — Z9889 Other specified postprocedural states: Secondary | ICD-10-CM | POA: Insufficient documentation

## 2019-09-19 DIAGNOSIS — Z1211 Encounter for screening for malignant neoplasm of colon: Secondary | ICD-10-CM | POA: Diagnosis not present

## 2019-09-19 DIAGNOSIS — K5904 Chronic idiopathic constipation: Secondary | ICD-10-CM | POA: Diagnosis not present

## 2019-09-25 DIAGNOSIS — Z9889 Other specified postprocedural states: Secondary | ICD-10-CM | POA: Diagnosis not present

## 2019-09-25 DIAGNOSIS — M25612 Stiffness of left shoulder, not elsewhere classified: Secondary | ICD-10-CM | POA: Diagnosis not present

## 2019-09-25 DIAGNOSIS — Z7409 Other reduced mobility: Secondary | ICD-10-CM | POA: Diagnosis not present

## 2019-09-25 DIAGNOSIS — M25512 Pain in left shoulder: Secondary | ICD-10-CM | POA: Diagnosis not present

## 2019-09-27 DIAGNOSIS — Z7409 Other reduced mobility: Secondary | ICD-10-CM | POA: Diagnosis not present

## 2019-09-27 DIAGNOSIS — Z9889 Other specified postprocedural states: Secondary | ICD-10-CM | POA: Diagnosis not present

## 2019-09-27 DIAGNOSIS — M25512 Pain in left shoulder: Secondary | ICD-10-CM | POA: Diagnosis not present

## 2019-09-27 DIAGNOSIS — M25612 Stiffness of left shoulder, not elsewhere classified: Secondary | ICD-10-CM | POA: Diagnosis not present

## 2019-10-02 DIAGNOSIS — Z1211 Encounter for screening for malignant neoplasm of colon: Secondary | ICD-10-CM | POA: Diagnosis not present

## 2019-10-02 DIAGNOSIS — D125 Benign neoplasm of sigmoid colon: Secondary | ICD-10-CM | POA: Diagnosis not present

## 2019-10-02 DIAGNOSIS — K635 Polyp of colon: Secondary | ICD-10-CM | POA: Diagnosis not present

## 2019-10-03 DIAGNOSIS — M25612 Stiffness of left shoulder, not elsewhere classified: Secondary | ICD-10-CM | POA: Diagnosis not present

## 2019-10-03 DIAGNOSIS — Z7409 Other reduced mobility: Secondary | ICD-10-CM | POA: Diagnosis not present

## 2019-10-03 DIAGNOSIS — Z9889 Other specified postprocedural states: Secondary | ICD-10-CM | POA: Diagnosis not present

## 2019-10-03 DIAGNOSIS — M25512 Pain in left shoulder: Secondary | ICD-10-CM | POA: Diagnosis not present

## 2019-10-09 DIAGNOSIS — Z9889 Other specified postprocedural states: Secondary | ICD-10-CM | POA: Diagnosis not present

## 2019-10-09 DIAGNOSIS — M25612 Stiffness of left shoulder, not elsewhere classified: Secondary | ICD-10-CM | POA: Diagnosis not present

## 2019-10-09 DIAGNOSIS — Z7409 Other reduced mobility: Secondary | ICD-10-CM | POA: Diagnosis not present

## 2019-10-09 DIAGNOSIS — M25512 Pain in left shoulder: Secondary | ICD-10-CM | POA: Diagnosis not present

## 2019-10-12 ENCOUNTER — Other Ambulatory Visit: Payer: Self-pay

## 2019-10-12 ENCOUNTER — Encounter (HOSPITAL_BASED_OUTPATIENT_CLINIC_OR_DEPARTMENT_OTHER): Payer: Self-pay | Admitting: *Deleted

## 2019-10-12 ENCOUNTER — Emergency Department (HOSPITAL_BASED_OUTPATIENT_CLINIC_OR_DEPARTMENT_OTHER): Payer: BC Managed Care – PPO

## 2019-10-12 ENCOUNTER — Emergency Department (HOSPITAL_BASED_OUTPATIENT_CLINIC_OR_DEPARTMENT_OTHER)
Admission: EM | Admit: 2019-10-12 | Discharge: 2019-10-13 | Disposition: A | Discharge status: Home / Self Care | Payer: BC Managed Care – PPO | Attending: Emergency Medicine | Admitting: Emergency Medicine

## 2019-10-12 DIAGNOSIS — Z79899 Other long term (current) drug therapy: Secondary | ICD-10-CM | POA: Diagnosis not present

## 2019-10-12 DIAGNOSIS — R42 Dizziness and giddiness: Secondary | ICD-10-CM | POA: Diagnosis not present

## 2019-10-12 DIAGNOSIS — H5500 Unspecified nystagmus: Secondary | ICD-10-CM

## 2019-10-12 DIAGNOSIS — Z20822 Contact with and (suspected) exposure to covid-19: Secondary | ICD-10-CM | POA: Diagnosis not present

## 2019-10-12 DIAGNOSIS — Z03818 Encounter for observation for suspected exposure to other biological agents ruled out: Secondary | ICD-10-CM | POA: Diagnosis not present

## 2019-10-12 DIAGNOSIS — I10 Essential (primary) hypertension: Secondary | ICD-10-CM | POA: Insufficient documentation

## 2019-10-12 HISTORY — DX: Essential (primary) hypertension: I10

## 2019-10-12 LAB — CBC WITH DIFFERENTIAL/PLATELET
Abs Immature Granulocytes: 0.04 10*3/uL (ref 0.00–0.07)
Basophils Absolute: 0 10*3/uL (ref 0.0–0.1)
Basophils Relative: 0 %
Eosinophils Absolute: 0.2 10*3/uL (ref 0.0–0.5)
Eosinophils Relative: 2 %
HCT: 42.1 % (ref 36.0–46.0)
Hemoglobin: 14.3 g/dL (ref 12.0–15.0)
Immature Granulocytes: 0 %
Lymphocytes Relative: 32 %
Lymphs Abs: 3.3 10*3/uL (ref 0.7–4.0)
MCH: 31.2 pg (ref 26.0–34.0)
MCHC: 34 g/dL (ref 30.0–36.0)
MCV: 91.9 fL (ref 80.0–100.0)
Monocytes Absolute: 0.7 10*3/uL (ref 0.1–1.0)
Monocytes Relative: 7 %
Neutro Abs: 6 10*3/uL (ref 1.7–7.7)
Neutrophils Relative %: 59 %
Platelets: 289 10*3/uL (ref 150–400)
RBC: 4.58 MIL/uL (ref 3.87–5.11)
RDW: 13 % (ref 11.5–15.5)
WBC: 10.2 10*3/uL (ref 4.0–10.5)
nRBC: 0 % (ref 0.0–0.2)

## 2019-10-12 LAB — COMPREHENSIVE METABOLIC PANEL
ALT: 21 U/L (ref 0–44)
AST: 26 U/L (ref 15–41)
Albumin: 4.3 g/dL (ref 3.5–5.0)
Alkaline Phosphatase: 77 U/L (ref 38–126)
Anion gap: 10 (ref 5–15)
BUN: 20 mg/dL (ref 6–20)
CO2: 23 mmol/L (ref 22–32)
Calcium: 9.5 mg/dL (ref 8.9–10.3)
Chloride: 106 mmol/L (ref 98–111)
Creatinine, Ser: 0.73 mg/dL (ref 0.44–1.00)
GFR calc Af Amer: >60 mL/min (ref >60)
GFR calc non Af Amer: >60 mL/min (ref >60)
Glucose, Bld: 117 mg/dL — ABNORMAL HIGH (ref 70–99)
Potassium: 4.1 mmol/L (ref 3.5–5.1)
Sodium: 139 mmol/L (ref 135–145)
Total Bilirubin: 0.4 mg/dL (ref 0.3–1.2)
Total Protein: 7.4 g/dL (ref 6.5–8.1)

## 2019-10-12 MED ORDER — ONDANSETRON HCL 4 MG/2ML IJ SOLN
INTRAMUSCULAR | Status: AC
  Filled 2019-10-12: qty 2

## 2019-10-12 MED ORDER — MECLIZINE HCL 25 MG PO TABS
25.0000 mg | ORAL_TABLET | Freq: Once | ORAL | Status: AC
  Administered 2019-10-12: 25 mg via ORAL
  Filled 2019-10-12: qty 1

## 2019-10-12 MED ORDER — ONDANSETRON HCL 4 MG/2ML IJ SOLN
4.0000 mg | Freq: Once | INTRAMUSCULAR | Status: AC
Start: 2019-10-12 — End: 2019-10-12
  Administered 2019-10-12: 4 mg via INTRAVENOUS

## 2019-10-12 MED ORDER — DIAZEPAM 5 MG/ML IJ SOLN
2.5000 mg | Freq: Once | INTRAMUSCULAR | Status: AC
  Administered 2019-10-12: 2.5 mg via INTRAVENOUS
  Filled 2019-10-12: qty 2

## 2019-10-12 MED ORDER — SODIUM CHLORIDE 0.9 % IV BOLUS
1000.0000 mL | Freq: Once | INTRAVENOUS | Status: AC
  Administered 2019-10-12: 22:00:00 1000 mL via INTRAVENOUS

## 2019-10-12 MED ORDER — ONDANSETRON HCL 4 MG/2ML IJ SOLN
4.0000 mg | Freq: Once | INTRAMUSCULAR | Status: AC
  Administered 2019-10-12: 22:00:00 4 mg via INTRAVENOUS
  Filled 2019-10-12: qty 2

## 2019-10-12 NOTE — ED Triage Notes (Signed)
Pt c/o dizziness x 1 day  

## 2019-10-12 NOTE — ED Notes (Signed)
ED Provider at bedside. 

## 2019-10-12 NOTE — ED Provider Notes (Signed)
[facility] Provider Note   CSN: [id number] Arrival date & time: 10/12/19  1443     History Chief Complaint  Patient presents with  . Dizziness    Roniyah Llorens is a 58 y.o. female.  HPI     At work this AM felt progressively dizzy, lightheaded, off balance walking, room spinning  When open eyes felt severe dizziness When open eyes start feeling dizzy and nauseas again Constant since this AM Keeping eyes closed and staying still makes it better Moving eyes, moving head openeing eyes makes it worse  No numbness/weakness, no difficulty talking No change of vision Instability from feeling dizzy  Borderline cholesterol No hx of CVA/DM No smoking, occ etoh, no other drugs  No new medications    Past Medical History:  Diagnosis Date  . Hypertension     Patient Active Problem List   Diagnosis Date Noted  . Neck pain 03/24/2019  . Back pain 03/24/2019  . Symptomatic mammary hypertrophy 03/24/2019  . S/P arthroscopy of knee 06/23/2016  . Chronic pain of both knees 03/26/2016    Past Surgical History:  Procedure Laterality Date  . ROTATOR CUFF REPAIR       OB History   No obstetric history on file.     History reviewed. No pertinent family history.  Social History   Tobacco Use  . Smoking status: Never Smoker  . Smokeless tobacco: Never Used  Substance Use Topics  . Alcohol use: Not Currently  . Drug use: Not Currently    Home Medications Prior to Admission medications   Medication Sig Start Date End Date Taking? Authorizing Provider  b complex vitamins tablet Take 1 tablet by mouth daily.   Yes [provider]  Diclofenac-miSOPROStol 75-0.2 MG TBEC diclofenac 75 mg-misoprostol 200 mcg tablet,immediate,delayed release 02/22/19  Yes [provider]  DULoxetine (CYMBALTA) 30 MG capsule Take 30 mg by mouth daily.   Yes [provider]  methocarbamol (ROBAXIN) 500 MG tablet As needed.   Yes  [provider]  metoprolol succinate (TOPROL-XL) 50 MG 24 hr tablet Take 1 tablet by mouth 2 (two) times daily.   Yes [provider]  omeprazole (PRILOSEC) 40 MG capsule Take 1 capsule by mouth daily. 03/27/19 03/26/20 Yes [provider]  ondansetron (ZOFRAN) 4 MG tablet Take 1 tablet (4 mg total) by mouth every 8 (eight) hours as needed for nausea or vomiting. 05/23/19  Yes Scheeler, Carola Rhine, PA-C  OVER THE COUNTER MEDICATION Multivitamin-Take 1 table by mouth daily.   Yes [provider]  traZODone (DESYREL) 50 MG tablet trazodone 50 mg tablet   Yes [provider]  rizatriptan (MAXALT-MLT) 10 MG disintegrating tablet As needed.    [provider]    Allergies    Neosporin [bacitracin-polymyxin b]  Review of Systems   Review of Systems  Constitutional: Negative for fever.  HENT: Positive for tinnitus (hx of tinnitus sees ENT). Negative for congestion and sore throat.   Eyes: Negative for visual disturbance.  Respiratory: Negative for cough and shortness of breath.   Cardiovascular: Negative for chest pain.  Gastrointestinal: Positive for nausea and vomiting. Negative for abdominal pain and diarrhea.  Genitourinary: Negative for difficulty urinating and dysuria.  Musculoskeletal: Negative for back pain and neck pain.  Skin: Negative for rash.  Neurological: Positive for dizziness and headaches (mild dull). Negative for syncope, facial asymmetry, speech difficulty, weakness and numbness.    Physical Exam Updated Vital Signs BP 131/77  Pulse 68   Temp 97.9 F (36.6 C) (Oral)   Resp 10   Ht 5\' 5"  (1.651 m)   Wt 78.9 kg   SpO2 98%   BMI 28.96 kg/m   Physical Exam Vitals and nursing note reviewed.  Constitutional:      General: She is not in acute distress.    Appearance: She is well-developed. She is not diaphoretic.  HENT:     Head: Normocephalic and atraumatic.  Eyes:     Extraocular Movements:     Right eye:  Nystagmus present.     Left eye: Nystagmus present.     Comments: Horizontal nystagmus with torsional component worsens when looking towards right, does not fatigue and continuous with forwards gaze, appears to improve somewhat with leftward gaze   Cardiovascular:     Rate and Rhythm: Normal rate and regular rhythm.     Heart sounds: Normal heart sounds. No murmur. No friction rub. No gallop.   Pulmonary:     Effort: Pulmonary effort is normal. No respiratory distress.  Abdominal:     General: There is no distension.     Palpations: Abdomen is soft.     Tenderness: There is no abdominal tenderness. There is no guarding.  Musculoskeletal:        General: No tenderness.     Cervical back: Normal range of motion.  Skin:    General: Skin is warm and dry.     Findings: No erythema or rash.  Neurological:     Mental Status: She is alert and oriented to person, place, and time.     GCS: GCS eye subscore is 4. GCS verbal subscore is 5. GCS motor subscore is 6.     Cranial Nerves: Cranial nerves are intact. No dysarthria or facial asymmetry.     Sensory: Sensation is intact. No sensory deficit.     Motor: Motor function is intact. No weakness.     Coordination: Romberg sign negative. Coordination normal. Heel to Shin Test normal.     Gait: Abnormal gait: appears unsteady but not overtly ataxic.     ED Results / Procedures / Treatments   Labs (all labs ordered are listed, but only abnormal results are displayed) Labs Reviewed  COMPREHENSIVE METABOLIC PANEL - Abnormal; Notable for the following components:      Result Value   Glucose, Bld 117 (*)    All other components within normal limits  SARS CORONAVIRUS 2 AG (30 MIN TAT)  CBC WITH DIFFERENTIAL/PLATELET    EKG None  Radiology CT Head Wo Contrast  Result Date: 10/12/2019 CLINICAL DATA:  Pt c/o acute dizziness x 1 day, no other complaints. No prior occurrence. EXAM: CT HEAD WITHOUT CONTRAST TECHNIQUE: Contiguous axial images  were obtained from the base of the skull through the vertex without intravenous contrast. COMPARISON:  None. FINDINGS: Brain: No evidence of acute infarction, hemorrhage, hydrocephalus, extra-axial collection or mass lesion/mass effect. Vascular: No hyperdense vessel or unexpected calcification. Skull: Normal. Negative for fracture or focal lesion. Sinuses/Orbits: No acute finding. Other: None. IMPRESSION: No acute intracranial process. Electronically Signed   By: 10/14/2019 M.D.   On: 10/12/2019 20:20    Procedures Procedures (including critical care time)  Medications Ordered in ED Medications  ondansetron [facility]) injection 4 mg (4 mg Intravenous Given 10/12/19 2002)  meclizine (ANTIVERT) tablet 25 mg (25 mg Oral Given 10/12/19 2135)  sodium chloride 0.9 % bolus 1,000 mL (0 mLs Intravenous Stopped 10/12/19 2243)  ondansetron (ZOFRAN) injection 4  mg (4 mg Intravenous Given 10/12/19 2134)  diazepam (VALIUM) injection 2.5 mg (2.5 mg Intravenous Given 10/12/19 2359)    ED Course  I have reviewed the triage vital signs and the nursing notes.  Pertinent labs & imaging results that were available during my care of the patient were reviewed by me and considered in my medical decision making (see chart for details).    MDM Rules/Calculators/A&P                      58yo female with history of hypertension, borderline dyslipidemia not on medications presents with concern for dizziness.   No sign of cardiac arrhythmia, anemia, or electrolyte abnormality.  Vertigo does have features of peripheral process with worsening with positions, movements, improvement with rest, no sign of significant focal abnormalities on exam, however has components concerning for potential central etiology including nystagmus not fatiguing with gaze and also has risk factors for potential central etiology.  Improved with meclizine however symptoms not resolved.  Given valium for dizziness/anxiety/nausea.   [facility] WWO contrast with thin slices through cerebellopontine angle.      Final Clinical Impression(s) / ED Diagnoses Final diagnoses:  Dizziness  Vertigo  Nystagmus    Rx / DC Orders ED Discharge Orders    None       Alvira MondaySchlossman, Karrisa Didio, MD 10/13/19 [phone]

## 2019-10-13 ENCOUNTER — Encounter (HOSPITAL_BASED_OUTPATIENT_CLINIC_OR_DEPARTMENT_OTHER): Payer: Self-pay | Admitting: *Deleted

## 2019-10-13 ENCOUNTER — Emergency Department (HOSPITAL_COMMUNITY): Payer: BC Managed Care – PPO

## 2019-10-13 DIAGNOSIS — Z79899 Other long term (current) drug therapy: Secondary | ICD-10-CM | POA: Diagnosis not present

## 2019-10-13 DIAGNOSIS — Z20822 Contact with and (suspected) exposure to covid-19: Secondary | ICD-10-CM | POA: Diagnosis not present

## 2019-10-13 DIAGNOSIS — H5500 Unspecified nystagmus: Secondary | ICD-10-CM | POA: Diagnosis not present

## 2019-10-13 DIAGNOSIS — R42 Dizziness and giddiness: Secondary | ICD-10-CM | POA: Diagnosis not present

## 2019-10-13 DIAGNOSIS — I10 Essential (primary) hypertension: Secondary | ICD-10-CM | POA: Diagnosis not present

## 2019-10-13 LAB — SARS CORONAVIRUS 2 AG (30 MIN TAT): SARS Coronavirus 2 Ag: NEGATIVE

## 2019-10-13 MED ORDER — KETOROLAC TROMETHAMINE 30 MG/ML IJ SOLN
30.0000 mg | Freq: Once | INTRAMUSCULAR | Status: AC
  Administered 2019-10-13: 07:00:00 30 mg via INTRAVENOUS
  Filled 2019-10-13: qty 1

## 2019-10-13 MED ORDER — GADOBUTROL 1 MMOL/ML IV SOLN
8.0000 mL | Freq: Once | INTRAVENOUS | Status: AC | PRN
  Administered 2019-10-13: 8 mL via INTRAVENOUS

## 2019-10-13 MED ORDER — MECLIZINE HCL 25 MG PO TABS: 25.0000 mg | ORAL_TABLET | Freq: Three times a day (TID) | ORAL | 0 refills | Status: DC | PRN

## 2019-10-13 MED ORDER — LORAZEPAM 2 MG/ML IJ SOLN
1.0000 mg | Freq: Once | INTRAMUSCULAR | Status: AC | PRN
  Administered 2019-10-13: 04:00:00 1 mg via INTRAVENOUS
  Filled 2019-10-13: qty 1

## 2019-10-13 NOTE — Discharge Instructions (Addendum)
Your MRI did not show any abnormalities.  Follow-up with your primary doctor.  Increase your fluid intake.

## 2019-10-13 NOTE — ED Notes (Signed)
Patient transported to MRI 

## 2019-10-13 NOTE — ED Notes (Signed)
Back from MRI.

## 2019-10-13 NOTE — ED Notes (Signed)
Report given to: Tresa Endo, RN at [facility] ED.

## 2019-10-13 NOTE — ED Notes (Signed)
Assisted pt to bsc. Denies dizziness and nausea. States she feels a little better. Awaiting transfer to [facility] ED.

## 2019-10-13 NOTE — ED Provider Notes (Signed)
  Physical Exam  BP 131/77   Pulse 79   Temp 98.5 F (36.9 C)   Resp 20   Ht 5\' 5"  (1.651 m)   Wt 78.9 kg   SpO2 100%   BMI 28.96 kg/m   Physical Exam  ED Course/Procedures     Procedures  MDM  Patient's MRI does not show any abnormalities would explain her symptoms.  The patient was sent here for further evaluation.  The patient states she is feeling better as well.  The patient is advised to follow-up with her primary doctor.  Told to return here as needed.      , PA-C 10/13/19 12/11/19    1281, MD 10/13/19 [phone]

## 2019-10-13 NOTE — ED Notes (Signed)
Pt. Reports a slight headache but nausea is not to bad. Dizziness still present but has decreased.

## 2019-10-17 DIAGNOSIS — M25512 Pain in left shoulder: Secondary | ICD-10-CM | POA: Diagnosis not present

## 2019-10-17 DIAGNOSIS — Z9889 Other specified postprocedural states: Secondary | ICD-10-CM | POA: Diagnosis not present

## 2019-10-17 DIAGNOSIS — Z7409 Other reduced mobility: Secondary | ICD-10-CM | POA: Diagnosis not present

## 2019-10-17 DIAGNOSIS — M25612 Stiffness of left shoulder, not elsewhere classified: Secondary | ICD-10-CM | POA: Diagnosis not present

## 2019-10-19 DIAGNOSIS — Z9889 Other specified postprocedural states: Secondary | ICD-10-CM | POA: Diagnosis not present

## 2019-10-19 DIAGNOSIS — M25512 Pain in left shoulder: Secondary | ICD-10-CM | POA: Diagnosis not present

## 2019-10-19 DIAGNOSIS — Z7409 Other reduced mobility: Secondary | ICD-10-CM | POA: Diagnosis not present

## 2019-10-19 DIAGNOSIS — M25612 Stiffness of left shoulder, not elsewhere classified: Secondary | ICD-10-CM | POA: Diagnosis not present

## 2019-10-27 DIAGNOSIS — R42 Dizziness and giddiness: Secondary | ICD-10-CM | POA: Diagnosis not present

## 2019-10-27 DIAGNOSIS — H903 Sensorineural hearing loss, bilateral: Secondary | ICD-10-CM | POA: Diagnosis not present

## 2019-10-27 DIAGNOSIS — G43119 Migraine with aura, intractable, without status migrainosus: Secondary | ICD-10-CM | POA: Diagnosis not present

## 2019-10-27 DIAGNOSIS — H938X1 Other specified disorders of right ear: Secondary | ICD-10-CM | POA: Diagnosis not present

## 2019-10-27 DIAGNOSIS — Z79899 Other long term (current) drug therapy: Secondary | ICD-10-CM | POA: Diagnosis not present

## 2019-10-27 DIAGNOSIS — G43809 Other migraine, not intractable, without status migrainosus: Secondary | ICD-10-CM | POA: Diagnosis not present

## 2019-11-03 DIAGNOSIS — H47333 Pseudopapilledema of optic disc, bilateral: Secondary | ICD-10-CM | POA: Diagnosis not present

## 2019-11-03 DIAGNOSIS — H43823 Vitreomacular adhesion, bilateral: Secondary | ICD-10-CM | POA: Diagnosis not present

## 2019-11-03 DIAGNOSIS — H35362 Drusen (degenerative) of macula, left eye: Secondary | ICD-10-CM | POA: Diagnosis not present

## 2019-11-08 DIAGNOSIS — M47812 Spondylosis without myelopathy or radiculopathy, cervical region: Secondary | ICD-10-CM | POA: Insufficient documentation

## 2019-11-08 DIAGNOSIS — M51369 Other intervertebral disc degeneration, lumbar region without mention of lumbar back pain or lower extremity pain: Secondary | ICD-10-CM | POA: Insufficient documentation

## 2019-11-08 DIAGNOSIS — M5136 Other intervertebral disc degeneration, lumbar region: Secondary | ICD-10-CM | POA: Insufficient documentation

## 2019-11-08 DIAGNOSIS — M159 Polyosteoarthritis, unspecified: Secondary | ICD-10-CM | POA: Insufficient documentation

## 2019-11-09 NOTE — Progress Notes (Signed)
NEUROLOGY CONSULTATION NOTE  Stephanie Hall MRN: [id number] DOB: 03/07/1961  Referring provider: Ermalinda Barrios, MD Primary care provider: Adrian Prince, MD  Reason for consult:  Vertigo, papilledema  HISTORY OF PRESENT ILLNESS: Stephanie Hall is a 59 year old right-handed white female with hypertension who presents for vertigo and papilledema.  History supplemented by ED note and discussion with referring provider, Dr. Dorma Russell.  CT and MRI of brain personally reviewed.  She works as an Charity fundraiser at [employer].  On 10/12/2019, she was at work walking down the hall when she suddenly felt dizzy.  She noted spinning vertigo and headache.  She went home but the vertigo became worse and she had nausea and vomiting.  She returned to the ED for evaluation.  She was given meclizine and Zofran which were ineffective.  Initial head CT was normal.  She was transported to [facility] for an MRI.  MRI brain with and without contrast were normal. Labs and EKG revealed no cardiac abnormality, anemia or electrolyte imbalance.  She was diagnosed with peripheral vertigo.  She felt wobbly for the next 3 days.  Then on 10/21/2019, she woke up with severe pounding and squeezing bilateral retro-orbital headache as well as nausea and feeling unsteady but no vertigo.  Symptoms subsided.  She had a recurrence on 10/25/2019 and symptoms have persisted.  She reports that her eyes feel like they are floating in syrup.  She reports some blurred vision, particularly with distance and her reading glasses don't work but she denies double vision and visual obscurations.  She has tinnitus but denies pulsatile tinnitus.  She reports right sided aural fullness.  Dizziness is aggravated by riding in a car.  She feels unsteady on her feet.  She finds it a little difficult to speak, like her tongue is swollen, as well as some word-finding difficulty.  She was evaluated by her ophthalmologist on 1/22 and found to have mild bilateral papilledema but  no vision loss.  She was placed on acetazolamide 500mg  twice daily.  She is currently taking a course of prednisone and has Valium on-hand.    She reports history of headaches all of her life, which sound like migraines.  They are normally severe pressure-like pain in the frontal region or posterior neck and occipital regions.  They are associated with nausea and photophobia.   PAST MEDICAL HISTORY: Past Medical History:  Diagnosis Date  . Hypertension     PAST SURGICAL HISTORY: Past Surgical History:  Procedure Laterality Date  . ROTATOR CUFF REPAIR      MEDICATIONS: Current Outpatient Medications on File Prior to Visit  Medication Sig Dispense Refill  . b complex vitamins tablet Take 1 tablet by mouth daily.    . Diclofenac-miSOPROStol 75-0.2 MG TBEC diclofenac 75 mg-misoprostol 200 mcg tablet,immediate,delayed release    . DULoxetine (CYMBALTA) 30 MG capsule Take 30 mg by mouth daily.    . meclizine (ANTIVERT) 25 MG tablet Take 1 tablet (25 mg total) by mouth 3 (three) times daily as needed for dizziness. 30 tablet 0  . methocarbamol (ROBAXIN) 500 MG tablet As needed.    . metoprolol succinate (TOPROL-XL) 50 MG 24 hr tablet Take 1 tablet by mouth 2 (two) times daily.    omeprazole (PRILOSEC) 40 MG capsule Take 1 capsule by mouth daily.    . ondansetron (ZOFRAN) 4 MG tablet Take 1 tablet (4 mg total) by mouth every 8 (eight) hours as needed for nausea or vomiting. 20 tablet 0  .  OVER THE COUNTER MEDICATION Multivitamin-Take 1 table by mouth daily.    . rizatriptan (MAXALT-MLT) 10 MG disintegrating tablet As needed.    . traZODone (DESYREL) 50 MG tablet trazodone 50 mg tablet     No current facility-administered medications on file prior to visit.    ALLERGIES: Allergies  Allergen Reactions  . Neosporin [Bacitracin-Polymyxin B] Hives and Rash    FAMILY HISTORY: No family history on file.  SOCIAL HISTORY: Social History   Socioeconomic History  . Marital status:  Married    Spouse name: Not on file  . Number of children: Not on file  . Years of education: Not on file  . Highest education level: Not on file  Occupational History  . Not on file  Tobacco Use  . Smoking status: Never Smoker  . Smokeless tobacco: Never Used  Substance and Sexual Activity  . Alcohol use: Not Currently  . Drug use: Not Currently  . Sexual activity: Not on file  Other Topics Concern  . Not on file  Social History Narrative  . Not on file   Social Determinants of Health   Financial Resource Strain:   . Difficulty of Paying Living Expenses: Not on file  Food Insecurity:   . Worried About Charity fundraiser in the Last Year: Not on file  . Ran Out of Food in the Last Year: Not on file  Transportation Needs:   . Lack of Transportation (Medical): Not on file  . Lack of Transportation (Non-Medical): Not on file  Physical Activity:   . Days of Exercise per Week: Not on file  . Minutes of Exercise per Session: Not on file  Stress:   . Feeling of Stress : Not on file  Social Connections:   . Frequency of Communication with Friends and Family: Not on file  . Frequency of Social Gatherings with Friends and Family: Not on file  . Attends Religious Services: Not on file  . Active Member of Clubs or Organizations: Not on file  . Attends Archivist Meetings: Not on file  . Marital Status: Not on file  Intimate Partner Violence:   . Fear of Current or Ex-Partner: Not on file  . Emotionally Abused: Not on file  . Physically Abused: Not on file  . Sexually Abused: Not on file    REVIEW OF SYSTEMS: Constitutional: No fevers, chills, or sweats, no generalized fatigue, change in appetite Eyes: No visual changes, double vision, eye pain Ear, nose and throat: No hearing loss, ear pain, nasal congestion, sore throat Cardiovascular: No chest pain, palpitations Respiratory:  No shortness of breath at rest or with exertion, wheezes GastrointestinaI: No nausea,  vomiting, diarrhea, abdominal pain, fecal incontinence Genitourinary:  No dysuria, urinary retention or frequency Musculoskeletal:  No neck pain, back pain Integumentary: No rash, pruritus, skin lesions Neurological: as above Psychiatric: No depression, insomnia, anxiety Endocrine: No palpitations, fatigue, diaphoresis, mood swings, change in appetite, change in weight, increased thirst Hematologic/Lymphatic:  No purpura, petechiae. Allergic/Immunologic: no itchy/runny eyes, nasal congestion, recent allergic reactions, rashes  PHYSICAL EXAM: Blood pressure (!) 154/95, pulse 60, height 5\' 5"  (1.651 m), weight 178 lb (80.7 kg), SpO2 97 %. General: No acute distress.  Patient appears well-groomed.   Head:  Normocephalic/atraumatic Eyes:  fundi examined but not visualized Neck: supple, no paraspinal tenderness, full range of motion Back: No paraspinal tenderness Heart: regular rate and rhythm Lungs: Clear to auscultation bilaterally. Vascular: No carotid bruits. Neurological Exam: Mental status: alert and oriented  to person, place, and time, recent and remote memory intact, fund of knowledge intact, attention and concentration intact, speech fluent and not dysarthric, language intact. Cranial nerves: CN I: not tested CN II: pupils equal, round and reactive to light, visual fields intact CN III, IV, VI:  full range of motion, no nystagmus, no ptosis CN V: facial sensation intact CN VII: upper and lower face symmetric CN VIII: hearing intact CN IX, X: gag intact, uvula midline CN XI: sternocleidomastoid and trapezius muscles intact CN XII: tongue midline Bulk & Tone: normal, no fasciculations. Motor:  5/5 throughout  Sensation:  Pinprick and vibration sensation intact. Deep Tendon Reflexes:  2+ throughout, toes downgoing.  Finger to nose testing:  Without dysmetria.  Heel to shin:  Without dysmetria.  Gait:  Normal station and stride.  Able to turn and tandem walk. Romberg  negative.  IMPRESSION: 1.  Persistent vertigo 2.  Papilledema  Imaging negative for posterior circulation stroke.  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  However, papilledema on exam raises the possibility of atypical presentation of increased intracranial hypertension.  Unusual age of onset.  No recent medications that I see that may be the cause.  She reports some word-finding difficulty which may be related to medication effect from the acetazolamide or her overall discomfort.  PLAN: 1. Lumbar puncture checking opening pressure and CSF analysis for cell count, cytology, protein, glucose, oligoclonal bands, IgG index, gram stain and culture. 2.  MRA and MRV of head to evaluate for dural sinus thrombosis or other intracranial vascular abnormality that may be contributing to symptoms. 3.  Continue acetazolamide 500mg  twice daily.  It may take several weeks for optimal effect (typically we recommend repeat ophthalmologic exam in 6 weeks after initiation of medication. 4.  Refilled diazepam as needed for acute symptoms.  Meclizine ineffective. 5.  Follow up with PCP regarding blood pressure. 6.  Follow up after testing.  Thank you for allowing me to take part in the care of this patient.  , DO  CC:  Shon Millet, MD  Adrian Prince, MD

## 2019-11-10 ENCOUNTER — Ambulatory Visit: Payer: BC Managed Care – PPO | Admitting: Neurology

## 2019-11-10 ENCOUNTER — Other Ambulatory Visit: Payer: Self-pay

## 2019-11-10 ENCOUNTER — Encounter: Payer: Self-pay | Admitting: Neurology

## 2019-11-10 VITALS — BP 154/95 | HR 60 | Ht 65.0 in | Wt 178.0 lb

## 2019-11-10 DIAGNOSIS — I10 Essential (primary) hypertension: Secondary | ICD-10-CM | POA: Diagnosis not present

## 2019-11-10 DIAGNOSIS — H814 Vertigo of central origin: Secondary | ICD-10-CM

## 2019-11-10 DIAGNOSIS — H4710 Unspecified papilledema: Secondary | ICD-10-CM

## 2019-11-10 DIAGNOSIS — E785 Hyperlipidemia, unspecified: Secondary | ICD-10-CM | POA: Diagnosis not present

## 2019-11-10 DIAGNOSIS — R42 Dizziness and giddiness: Secondary | ICD-10-CM | POA: Diagnosis not present

## 2019-11-10 MED ORDER — DIAZEPAM 5 MG PO TABS: 5.0000 mg | ORAL_TABLET | Freq: Four times a day (QID) | ORAL | 0 refills | Status: DC | PRN

## 2019-11-10 NOTE — Addendum Note (Signed)
Addended by: Shelly Bombard on: 11/10/2019 01:38 PM   Modules accepted: Orders

## 2019-11-10 NOTE — Patient Instructions (Addendum)
1.  We will check MRA and MRV of head 2.  We will schedule for LP 3.  Continue acetazolamide 500mg  twice daily for now.   4.  Diazepam has been refilled. 5.  Follow up after testing.  We have sent a referral to [facility] for your MRA  And MRV, lumbar spine tap.They will call you directly to schedule your appointment. They are located at [address] [facility]. If you need to contact them directly please call [phone].

## 2019-11-14 DIAGNOSIS — G932 Benign intracranial hypertension: Secondary | ICD-10-CM | POA: Diagnosis not present

## 2019-11-14 DIAGNOSIS — H47333 Pseudopapilledema of optic disc, bilateral: Secondary | ICD-10-CM | POA: Diagnosis not present

## 2019-11-14 DIAGNOSIS — H43823 Vitreomacular adhesion, bilateral: Secondary | ICD-10-CM | POA: Diagnosis not present

## 2019-11-14 DIAGNOSIS — H35362 Drusen (degenerative) of macula, left eye: Secondary | ICD-10-CM | POA: Diagnosis not present

## 2019-11-17 ENCOUNTER — Telehealth: Payer: Self-pay

## 2019-11-17 ENCOUNTER — Other Ambulatory Visit: Payer: Self-pay | Admitting: Neurology

## 2019-11-17 ENCOUNTER — Other Ambulatory Visit: Payer: Self-pay

## 2019-11-17 MED ORDER — TOPIRAMATE 25 MG PO TABS: ORAL_TABLET | ORAL | 0 refills | Status: DC

## 2019-11-17 NOTE — Telephone Encounter (Signed)
-----   Message from Drema Dallas, DO sent at 11/17/2019 12:40 PM EST ----- Please let Ms. Hickling know that I spoke with her ophthalmologist, Dr. Allyne Gee, and that I would like her to discontinue the acetazolamide until further notice.  I want to see what the opening pressure of the lumbar puncture shows.

## 2019-11-17 NOTE — Telephone Encounter (Signed)
[facility]

## 2019-11-17 NOTE — Telephone Encounter (Signed)
Prescription sent

## 2019-11-17 NOTE — Telephone Encounter (Signed)
Since headaches persist despite the acetazolamide, I still want her to stop.  I think her symptoms are likely migraine.  Instead, I would like her to start topiramate 25mg  at bedtime for one week, then increase to 50mg  at bedtime, which is effective in treating migraines.  Side effects may include numbness and tingling sensation, so she shouldn't be worried if she experiences this.  It typically resolves when her body adjusts to the medication.

## 2019-11-17 NOTE — Telephone Encounter (Signed)
Has had headache for 4 days, MRA is Monday, MRV is Thursday, and spinal tap is Friday the 11/24/2019. 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 It was in the exam notes per patient.

## 2019-11-17 NOTE — Telephone Encounter (Signed)
Pt advised to take tylenol if needed follow up after test next week.

## 2019-11-20 ENCOUNTER — Other Ambulatory Visit: Payer: Self-pay

## 2019-11-20 ENCOUNTER — Ambulatory Visit
Admission: RE | Admit: 2019-11-20 | Discharge: 2019-11-20 | Disposition: A | Discharge status: Home / Self Care | Payer: BC Managed Care – PPO | Source: Ambulatory Visit | Attending: Neurology | Admitting: Neurology

## 2019-11-20 DIAGNOSIS — H4710 Unspecified papilledema: Secondary | ICD-10-CM

## 2019-11-20 DIAGNOSIS — R42 Dizziness and giddiness: Secondary | ICD-10-CM | POA: Diagnosis not present

## 2019-11-20 DIAGNOSIS — H814 Vertigo of central origin: Secondary | ICD-10-CM

## 2019-11-23 ENCOUNTER — Ambulatory Visit
Admission: RE | Admit: 2019-11-23 | Discharge: 2019-11-23 | Disposition: A | Discharge status: Home / Self Care | Payer: BC Managed Care – PPO | Source: Ambulatory Visit | Attending: Neurology | Admitting: Neurology

## 2019-11-23 DIAGNOSIS — H4710 Unspecified papilledema: Secondary | ICD-10-CM | POA: Diagnosis not present

## 2019-11-23 MED ORDER — GADOBENATE DIMEGLUMINE 529 MG/ML IV SOLN
16.0000 mL | Freq: Once | INTRAVENOUS | Status: AC | PRN
  Administered 2019-11-23: 16 mL via INTRAVENOUS

## 2019-11-24 ENCOUNTER — Ambulatory Visit
Admission: RE | Admit: 2019-11-24 | Discharge: 2019-11-24 | Disposition: A | Discharge status: Home / Self Care | Payer: BC Managed Care – PPO | Source: Ambulatory Visit | Attending: Neurology | Admitting: Neurology

## 2019-11-24 ENCOUNTER — Telehealth: Payer: Self-pay | Admitting: Neurology

## 2019-11-24 ENCOUNTER — Other Ambulatory Visit: Payer: Self-pay

## 2019-11-24 ENCOUNTER — Other Ambulatory Visit (HOSPITAL_COMMUNITY)
Admission: RE | Admit: 2019-11-24 | Discharge: 2019-11-24 | Disposition: A | Discharge status: Home / Self Care | Payer: BC Managed Care – PPO | Source: Ambulatory Visit | Attending: Neurology | Admitting: Neurology

## 2019-11-24 DIAGNOSIS — H814 Vertigo of central origin: Secondary | ICD-10-CM | POA: Diagnosis not present

## 2019-11-24 DIAGNOSIS — H4710 Unspecified papilledema: Secondary | ICD-10-CM

## 2019-11-24 NOTE — Progress Notes (Signed)
Blood drawn from pts left AC to be sent with LP labs. Pt tolerated well.

## 2019-11-24 NOTE — Progress Notes (Signed)
Blood obtained from Pt's L AC for LP labs by Rolland Bimler, RN. Pt tolerated procedure well. Site is unremarkable.

## 2019-11-24 NOTE — Discharge Instructions (Signed)

## 2019-11-24 NOTE — Telephone Encounter (Signed)
Error

## 2019-11-27 LAB — CYTOLOGY - NON PAP

## 2019-11-29 LAB — CNS IGG SYNTHESIS RATE, CSF+BLOOD
Albumin Serum: 3.9 g/dL (ref 3.2–4.6)
Albumin, CSF: 33 mg/dL (ref 8.0–42.0)
CNS-IgG Synthesis Rate: 1.3 mg/(24.h) (ref <=3.3)
IgG (Immunoglobin G), Serum: 814 mg/dL (ref 600–1540)
IgG Total CSF: 3.9 mg/dL (ref 0.8–7.7)
IgG-Index: 0.57 (ref <0.66)

## 2019-11-29 LAB — ANGIOTENSIN CONVERTING ENZYME, CSF: ACE, CSF: 6 U/L (ref <=15)

## 2019-11-29 LAB — CSF CULTURE W GRAM STAIN
MICRO NUMBER:: 10146593
Result:: NO GROWTH
SPECIMEN QUALITY:: ADEQUATE

## 2019-11-29 LAB — OLIGOCLONAL BANDS, CSF + SERM

## 2019-11-29 LAB — CSF CELL COUNT WITH DIFFERENTIAL
RBC Count, CSF: 0 {cells}/uL
WBC, CSF: 0 {cells}/uL (ref 0–5)

## 2019-11-29 LAB — GLUCOSE, CSF: Glucose, CSF: 52 mg/dL (ref 40–80)

## 2019-11-29 LAB — MYELIN BASIC PROTEIN, CSF: Myelin Basic Protein: <2 mcg/L (ref 2.0–4.0)

## 2019-11-29 LAB — PROTEIN, CSF: Total Protein, CSF: 55 mg/dL (ref 15–60)

## 2019-11-30 ENCOUNTER — Other Ambulatory Visit: Payer: BC Managed Care – PPO

## 2019-11-30 ENCOUNTER — Ambulatory Visit
Admission: RE | Admit: 2019-11-30 | Discharge: 2019-11-30 | Disposition: A | Discharge status: Home / Self Care | Payer: BC Managed Care – PPO | Source: Ambulatory Visit | Attending: Neurology | Admitting: Neurology

## 2019-11-30 DIAGNOSIS — H4710 Unspecified papilledema: Secondary | ICD-10-CM

## 2019-11-30 DIAGNOSIS — R519 Headache, unspecified: Secondary | ICD-10-CM | POA: Diagnosis not present

## 2019-11-30 MED ORDER — IOPAMIDOL (ISOVUE-370) INJECTION 76%
75.0000 mL | Freq: Once | INTRAVENOUS | Status: AC | PRN
  Administered 2019-11-30: 75 mL via INTRAVENOUS

## 2019-12-04 ENCOUNTER — Telehealth: Payer: Self-pay | Admitting: Neurology

## 2019-12-04 NOTE — Telephone Encounter (Signed)
Okay to do so? 

## 2019-12-04 NOTE — Telephone Encounter (Signed)
Patient called in regarding wanting to know if she can get the COVID vaccine? Please Call. Thank you

## 2019-12-05 NOTE — Telephone Encounter (Signed)
I have no objection from the neurologic standpoint.

## 2019-12-05 NOTE — Telephone Encounter (Signed)
Patient called back on 12/04/19 regarding getting her COVID vaccine. She said that she has had a lot of testing and she was told to wait until after the testing is finished before having the vaccine but she can get a COVID vaccine this Thursday? Please Call. Thank you

## 2019-12-12 ENCOUNTER — Other Ambulatory Visit: Payer: BC Managed Care – PPO

## 2019-12-12 ENCOUNTER — Other Ambulatory Visit: Payer: Self-pay | Admitting: Neurology

## 2019-12-19 ENCOUNTER — Ambulatory Visit: Payer: BC Managed Care – PPO | Admitting: Plastic Surgery

## 2020-01-05 ENCOUNTER — Other Ambulatory Visit: Payer: Self-pay | Admitting: Neurology

## 2020-01-16 ENCOUNTER — Other Ambulatory Visit: Payer: Self-pay

## 2020-01-16 ENCOUNTER — Encounter: Payer: Self-pay | Admitting: Plastic Surgery

## 2020-01-16 ENCOUNTER — Ambulatory Visit (INDEPENDENT_AMBULATORY_CARE_PROVIDER_SITE_OTHER): Payer: BC Managed Care – PPO | Admitting: Plastic Surgery

## 2020-01-16 VITALS — BP 130/79 | HR 71 | Temp 97.8°F | Ht 64.0 in | Wt 179.0 lb

## 2020-01-16 DIAGNOSIS — N62 Hypertrophy of breast: Secondary | ICD-10-CM

## 2020-01-16 DIAGNOSIS — Z9889 Other specified postprocedural states: Secondary | ICD-10-CM | POA: Diagnosis not present

## 2020-01-16 NOTE — Progress Notes (Signed)
The patient is a 59 year old female here for follow-up after undergoing bilateral breast reduction.  She has done extremely well.  All incisions are healing very nicely.  There is no sign of breakdown.  There are no areas of concern a disorder standpoint.  She has some questions about some excess tissue laterally.  I think this is part of the normal mid axillary excess tissue that occurs with both weight and age.  We redid her pictures and compared them to her preop.  She is doing incredibly well keeping her weight down and working on the weight reduction.  She is going to continue to work on that and come back and see [place] in the next few months.  There is a little bit of puckering at the vertical limb of each breast.  This could be revised in the office.  The lateral portion the she is noticing I could not do in the office.  That would need to be done in the operating room with liposuction and may be excision.  I lean towards lipomas I would not want to have to put scars that far lateral.  Pictures were obtained of the patient and placed in the chart with the patient's or guardian's permission.

## 2020-03-04 ENCOUNTER — Other Ambulatory Visit: Payer: Self-pay | Admitting: Endocrinology

## 2020-03-04 DIAGNOSIS — Z1389 Encounter for screening for other disorder: Secondary | ICD-10-CM | POA: Diagnosis not present

## 2020-03-04 DIAGNOSIS — I10 Essential (primary) hypertension: Secondary | ICD-10-CM | POA: Diagnosis not present

## 2020-03-04 DIAGNOSIS — R0609 Other forms of dyspnea: Secondary | ICD-10-CM | POA: Diagnosis not present

## 2020-03-04 DIAGNOSIS — R002 Palpitations: Secondary | ICD-10-CM | POA: Diagnosis not present

## 2020-03-04 DIAGNOSIS — E7849 Other hyperlipidemia: Secondary | ICD-10-CM | POA: Diagnosis not present

## 2020-03-05 ENCOUNTER — Other Ambulatory Visit: Payer: Self-pay | Admitting: Endocrinology

## 2020-03-05 DIAGNOSIS — I10 Essential (primary) hypertension: Secondary | ICD-10-CM

## 2020-03-05 DIAGNOSIS — E785 Hyperlipidemia, unspecified: Secondary | ICD-10-CM

## 2020-03-21 ENCOUNTER — Ambulatory Visit
Admission: RE | Admit: 2020-03-21 | Discharge: 2020-03-21 | Disposition: A | Discharge status: Home / Self Care | Payer: Self-pay | Source: Ambulatory Visit | Attending: Endocrinology | Admitting: Endocrinology

## 2020-03-21 DIAGNOSIS — E785 Hyperlipidemia, unspecified: Secondary | ICD-10-CM

## 2020-03-21 DIAGNOSIS — I10 Essential (primary) hypertension: Secondary | ICD-10-CM

## 2020-04-01 ENCOUNTER — Other Ambulatory Visit: Payer: Self-pay | Admitting: Neurology

## 2020-04-19 ENCOUNTER — Ambulatory Visit (INDEPENDENT_AMBULATORY_CARE_PROVIDER_SITE_OTHER): Payer: BC Managed Care – PPO | Admitting: Plastic Surgery

## 2020-04-19 ENCOUNTER — Other Ambulatory Visit: Payer: Self-pay

## 2020-04-19 ENCOUNTER — Encounter: Payer: Self-pay | Admitting: Plastic Surgery

## 2020-04-19 VITALS — BP 123/63 | HR 67 | Temp 97.3°F | Wt 165.0 lb

## 2020-04-19 DIAGNOSIS — N62 Hypertrophy of breast: Secondary | ICD-10-CM

## 2020-04-19 NOTE — Progress Notes (Signed)
   Subjective:    Patient ID: Stephanie Hall, female    DOB: 1960/11/30, 59 y.o.   MRN: [id number]  The patient is a lovely 59 year old female here for follow-up on her bilateral breast reduction.  The patient had her reduction about a year ago.  She is 5 feet 4 inches tall and 165 pounds.  She has lost 20 pounds from a year ago with diet and exercise.  This may have contributed to a little bit of scar contracture of the right inferior breast and excess tissue of the lateral breasts bilaterally.  The right breast at the bottom of the vertical limb poles and create some tension at times due to the scar tissue.  She notices that she feels it when her arms are to her side.  She is otherwise happy with her results.  She is doing very well and not had any recent illnesses.  Her last mammogram within the year was negative.  She is not a smoker.   Review of Systems  Constitutional: Negative.   HENT: Negative.   Eyes: Negative.   Respiratory: Negative.  Negative for chest tightness and shortness of breath.   Cardiovascular: Negative.  Negative for leg swelling.  Gastrointestinal: Negative.   Endocrine: Negative.   Genitourinary: Negative.   Musculoskeletal: Negative.   Hematological: Negative.        Objective:   Physical Exam Vitals and nursing note reviewed.  Constitutional:      Appearance: Normal appearance.  HENT:     Head: Normocephalic and atraumatic.  Eyes:     Extraocular Movements: Extraocular movements intact.  Cardiovascular:     Rate and Rhythm: Normal rate.     Pulses: Normal pulses.  Pulmonary:     Effort: Pulmonary effort is normal.  Abdominal:     General: Abdomen is flat. There is no distension.  Skin:    General: Skin is warm.     Capillary Refill: Capillary refill takes less than 2 seconds.  Neurological:     General: No focal deficit present.     Mental Status: She is alert. Mental status is at baseline.  Psychiatric:        Mood and Affect: Mood normal.         Behavior: Behavior normal.        Thought Content: Thought content normal.         Assessment & Plan:     ICD-10-CM   1. Symptomatic mammary hypertrophy  N62     Recommend excision of scar tissue and contraction of right breast with excision of bilateral lateral breast tissue.  This will create a scar.  I was trying to prevent this for the patient but she states she is totally okay with that and rather have the scar than the tissue.  We can try and do this in the office. Pictures were obtained of the patient and placed in the chart with the patient's or guardian's permission.

## 2020-05-02 DIAGNOSIS — M170 Bilateral primary osteoarthritis of knee: Secondary | ICD-10-CM | POA: Diagnosis not present

## 2020-05-02 DIAGNOSIS — M2242 Chondromalacia patellae, left knee: Secondary | ICD-10-CM | POA: Diagnosis not present

## 2020-05-02 DIAGNOSIS — M25561 Pain in right knee: Secondary | ICD-10-CM | POA: Diagnosis not present

## 2020-05-14 ENCOUNTER — Other Ambulatory Visit: Payer: Self-pay | Admitting: Neurology

## 2020-06-22 ENCOUNTER — Other Ambulatory Visit: Payer: Self-pay | Admitting: Neurology

## 2020-08-15 DIAGNOSIS — M170 Bilateral primary osteoarthritis of knee: Secondary | ICD-10-CM | POA: Diagnosis not present

## 2020-08-23 ENCOUNTER — Other Ambulatory Visit: Payer: Self-pay

## 2020-08-23 ENCOUNTER — Ambulatory Visit (INDEPENDENT_AMBULATORY_CARE_PROVIDER_SITE_OTHER): Payer: BC Managed Care – PPO | Admitting: Plastic Surgery

## 2020-08-23 ENCOUNTER — Encounter: Payer: Self-pay | Admitting: Plastic Surgery

## 2020-08-23 DIAGNOSIS — Z719 Counseling, unspecified: Secondary | ICD-10-CM

## 2020-08-23 NOTE — Progress Notes (Signed)
The patient is a 59 year old female here for follow-up after bilateral breast reduction.  The patient had been considering a revision to the lateral aspect of the breast.  She has lost a significant amount of weight.  We were going to do lateral excision.  I am concerned about the length that the scar will have to be in order to provide a big enough difference.  After a lengthy discussion we have opted to wait.  I will talk with Dr. Valentina Gu and see if there is a way we can do this at the same time as her knee replacement in January.  I do not think it would add extra time to her surgery.  I think if I could use the liposuction she will be much happier with the end result.  Botulinum Toxin Procedure Note  Procedure: Cosmetic botulinum toxin and Filler administration  Pre-operative Diagnosis: Dynamic rhytides and midface volume loss  Post-operative Diagnosis: Same  Complications:  None  Brief history: The patient desires botulinum toxin injection of her forehead. I discussed with the patient this proposed procedure of botulinum toxin injections, which is customized depending on the particular needs of the patient. It is performed on facial rhytids as a temporary correction. The alternatives were discussed with the patient. The risks were addressed including bleeding, scarring, infection, damage to deeper structures, asymmetry, and chronic pain, which may occur infrequently after a procedure. The individual's choice to undergo a surgical procedure is based on the comparison of risks to potential benefits. Other risks include unsatisfactory results, brow ptosis, eyelid ptosis, allergic reaction, temporary paralysis, which should go away with time, bruising, blurring disturbances and delayed healing. Botulinum toxin injections do not arrest the aging process or produce permanent tightening of the eyelid.  Operative intervention maybe necessary to maintain the results of a blepharoplasty or botulinum toxin. The  patient understands and wishes to proceed.  Procedure: The area was prepped with alcohol and dried with a clean gauze. Using a clean technique, the botulinum toxin was diluted with 1.25 cc of preservative-free normal saline which was slowly injected with an 18 gauge needle in a tuberculin syringes.  A 32 gauge needles were then used to inject the botulinum toxin. This mixture allow for an aliquot of 5 units per 0.1 cc in each injection site.    Subsequently the mixture was injected in the glabellar and midforehead area.  A total of 12 Units of botulinum toxin was used.  No complications were noted. Light pressure was held for 5 minutes. She was instructed explicitly in post-operative care.  Botox EXP:  1/23

## 2020-08-27 ENCOUNTER — Other Ambulatory Visit: Payer: Self-pay

## 2020-08-27 ENCOUNTER — Ambulatory Visit (INDEPENDENT_AMBULATORY_CARE_PROVIDER_SITE_OTHER): Payer: BC Managed Care – PPO | Admitting: Neurology

## 2020-08-27 ENCOUNTER — Encounter: Payer: Self-pay | Admitting: Neurology

## 2020-08-27 VITALS — BP 152/87 | HR 75 | Ht 64.0 in | Wt 136.4 lb

## 2020-08-27 DIAGNOSIS — G43009 Migraine without aura, not intractable, without status migrainosus: Secondary | ICD-10-CM

## 2020-08-27 DIAGNOSIS — R42 Dizziness and giddiness: Secondary | ICD-10-CM

## 2020-08-27 MED ORDER — AIMOVIG 70 MG/ML ~~LOC~~ SOAJ: 70.0000 mg | SUBCUTANEOUS | 5 refills | Status: DC

## 2020-08-27 NOTE — Progress Notes (Signed)
NEUROLOGY FOLLOW UP OFFICE NOTE  Guynell Kleiber [id number]   Subjective:  Stephanie Hall is a 59 year old right-handed white female with hypertension who follows up for vertigo.  MRV, MRA and CTA of head personally reviewed.  UPDATE: Last seen in January for initial consultation for evaluation of increased intracranial pressure presenting as vertigo and papilledema.  After that visit, I spoke with patient's ophthalmologist, Dr. Allyne Gee, who wasn't convinced that she definitely had papilledema on exam.  I still wanted to proceed with workup with MRA/MRV of head and lumbar puncture.  However, thinking that her vertigo may be migraine, I wanted her to switch from acetazolamide to topiramate 25mg  twice daily.  MRA of head on 11/20/2019 showed possible moderate proximal left M2 stenosis but. follow up CTA of head on 11/30/2019 confirmed it was artifact.  MRV of head on 11/23/2019 showed no stenosis or thrombosis.  Lumbar punctre on 11/24/2019 demonstrated normal opening pressure of 16 cm water.  At that point, she was lost to follow up.    Still still has balance problems although no longer with dizziness.  Still endorses tinnitus and maybe some hearing difficulty.  She can tolerate riding in a car better.  Sudden eye movements are aggravating.  She notes headaches are often now cervicogenic, with a trigger point in the back of her neck.  Stress, alcohol and allergans are a trigger.  They occur 1 to 2 times a week.    In last 2-3 months, she started having short-term memory problems, such as recall and word-finding difficulty.  So she cut the topiramate from 25mg  twice daily to once daily about 3 weeks ago.  It has helped.  Current medications:  topiramate 25mg  daily, duloxetine 30mg  daily  HISTORY: She works as an 01/22/2020 at .  On 10/12/2019, she was at work walking down the hall when she suddenly felt dizzy.  She noted spinning vertigo and headache.  She went home but the vertigo  became worse and she had nausea and vomiting.  She returned to the ED for evaluation.  She was given meclizine and Zofran which were ineffective.  Initial head CT was normal.  She was transported to [facility] for an MRI.  MRI brain with and without contrast were normal. Labs and EKG revealed no cardiac abnormality, anemia or electrolyte imbalance.  She was diagnosed with peripheral vertigo.  She felt wobbly for the next 3 days.  Then on 10/21/2019, she woke up with severe pounding and squeezing bilateral retro-orbital headache as well as nausea and feeling unsteady but no vertigo.  Symptoms subsided.  She had a recurrence on 10/25/2019 and symptoms have persisted.  She reports that her eyes feel like they are floating in syrup.  She reports some blurred vision, particularly with distance and her reading glasses don't work but she denies double vision and visual obscurations.  She has tinnitus but denies pulsatile tinnitus.  She reports right sided aural fullness.  Dizziness is aggravated by riding in a car.  She feels unsteady on her feet.  She finds it a little difficult to speak, like her tongue is swollen, as well as some word-finding difficulty.  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  She was placed on acetazolamide 500mg  twice daily and was started on a course of prednisone and has Valium on-hand.    She reports history of headaches all of her life, which sound like migraines.  They are normally severe pressure-like pain in the frontal region or posterior neck and occipital regions.  They are associated with nausea and photophobia.   PAST MEDICAL HISTORY: Past Medical History:  Diagnosis Date  . Hypertension     MEDICATIONS: Current Outpatient Medications on File Prior to Visit  Medication Sig Dispense Refill  . acetaZOLAMIDE (DIAMOX) 500 MG capsule     . b complex vitamins tablet Take 1 tablet by mouth daily.    . diazepam  (VALIUM) 5 MG tablet Take 1 tablet (5 mg total) by mouth every 6 (six) hours as needed. 30 tablet 0  . Diclofenac-miSOPROStol 75-0.2 MG TBEC diclofenac 75 mg-misoprostol 200 mcg tablet,immediate,delayed release    . DULoxetine (CYMBALTA) 20 MG capsule Take by mouth.    . DULoxetine (CYMBALTA) 30 MG capsule Take 30 mg by mouth daily.    Marland Kitchen linaclotide (LINZESS) 145 MCG CAPS capsule Take by mouth.    . meclizine (ANTIVERT) 25 MG tablet Take 1 tablet (25 mg total) by mouth 3 (three) times daily as needed for dizziness. 30 tablet 0  . methocarbamol (ROBAXIN) 500 MG tablet As needed.    . metoprolol succinate (TOPROL-XL) 50 MG 24 hr tablet Take 1 tablet by mouth 2 (two) times daily.    . metoprolol succinate (TOPROL-XL) 50 MG 24 hr tablet Take by mouth.    Marland Kitchen omeprazole (PRILOSEC) 40 MG capsule Take 1 capsule by mouth daily.    . ondansetron (ZOFRAN) 4 MG tablet Take 1 tablet (4 mg total) by mouth every 8 (eight) hours as needed for nausea or vomiting. 20 tablet 0  . OVER THE COUNTER MEDICATION Multivitamin-Take 1 table by mouth daily.    . predniSONE (DELTASONE) 20 MG tablet Take 1 tab TID x 1 wk, then 1 tab BID x 2 days, then 1 tab once/day x 2 days, the 1/2 tab once per day for 2 days. Please take medication with food.    . rizatriptan (MAXALT-MLT) 10 MG disintegrating tablet As needed.    . topiramate (TOPAMAX) 25 MG tablet TAKE 1 TABLET BY MOUTH TWICE DAILY. PATIENT MUST MAKE FOLLOW UP APPOINTMENT FOR FURTHER REFILLS. 180 tablet 0  . traZODone (DESYREL) 50 MG tablet trazodone 50 mg tablet     No current facility-administered medications on file prior to visit.    ALLERGIES: Allergies  Allergen Reactions  . Adhesive  [Tape] Rash  . Contrast Media  [Iodinated Diagnostic Agents] Dermatitis, Itching and Rash  . Neosporin [Bacitracin-Polymyxin B] Hives and Rash    FAMILY HISTORY: No family history on file.   SOCIAL HISTORY: Social History   Socioeconomic History  . Marital status:  Married    Spouse name: Not on file  . Number of children: Not on file  . Years of education: Not on file  . Highest education level: Not on file  Occupational History  . Not on file  Tobacco Use  . Smoking status: Never Smoker  . Smokeless tobacco: Never Used  Substance and Sexual Activity  . Alcohol use: Not Currently  . Drug use: Not Currently  . Sexual activity: Not on file  Other Topics Concern  . Not on file  Social History Narrative  . Not on file   Social Determinants of Health   Financial Resource Strain:   . Difficulty of Paying Living Expenses: Not on file  Food Insecurity:   . Worried About Programme researcher, broadcasting/film/video in the Last Year: Not on file  . Ran Out of Food  in the Last Year: Not on file  Transportation Needs:   . Lack of Transportation (Medical): Not on file  . Lack of Transportation (Non-Medical): Not on file  Physical Activity:   . Days of Exercise per Week: Not on file  . Minutes of Exercise per Session: Not on file  Stress:   . Feeling of Stress : Not on file  Social Connections:   . Frequency of Communication with Friends and Family: Not on file  . Frequency of Social Gatherings with Friends and Family: Not on file  . Attends Religious Services: Not on file  . Active Member of Clubs or Organizations: Not on file  . Attends Banker Meetings: Not on file  . Marital Status: Not on file  Intimate Partner Violence:   . Fear of Current or Ex-Partner: Not on file  . Emotionally Abused: Not on file  . Physically Abused: Not on file  . Sexually Abused: Not on file     Objective:  Blood pressure (!) 152/87, pulse 75, height 5\' 4"  (1.626 m), weight 136 lb 6.4 oz (61.9 kg), SpO2 99 %. General: No acute distress.  Patient appears well-groomed.   Head:  Normocephalic/atraumatic Eyes:  Fundi examined but not visualized Neck: supple, no paraspinal tenderness, full range of motion Heart:  Regular rate and rhythm Lungs:  Clear to auscultation  bilaterally Back: No paraspinal tenderness Neurological Exam: alert and oriented to person, place, and time. Attention span and concentration intact, recent and remote memory intact, fund of knowledge intact.  Speech fluent and not dysarthric, language intact.  CN II-XII intact. Bulk and tone normal, muscle strength 5/5 throughout.  Sensation to pinprick and vibration intact.  Deep tendon reflexes 2+ throughout, toes downgoing.  Finger to nose and heel to shin testing intact.  Gait steady.  Unsteady with tandem walk, Romberg with mild sway.   Assessment/Plan:   1.  Migraine without aura, without status migrainosus, not intractable 2.  Disequilibrium.  I doubt that this is migraine.  It may be possible that she previously had a vestibular neuritis and continues to have mild chronic residual symptoms.  1.  Due to side effects, will discontinue topiramate and start Aimovig 70mg  every 28 days 2.  Limit use of pain relievers to no more than 2 days out of week to prevent risk of rebound or medication-overuse headache. 3.  Keep headache diary 4.  She has Robaxin for neck spasms, which she may continue as it can prevent onset of headache. 5.  Follow up in 6 months.  , DO  CC: , MD

## 2020-08-27 NOTE — Patient Instructions (Signed)
1.  Migraine 2.  Disequilibrium.  At this point, I cannot say this is migraine.  Could you have had a severe case of vestibular neuritis last year and unfortunately have residual disequilbrium?   1.  Stop topiramate.  Start Aimovig 70mg  every 28 days 2.  Limit use of pain relievers to no more than 2 days out of week to prevent risk of rebound or medication-overuse headache. 3.  Keep headache diary 4.  May use methocarbamol for neck pain 5.  Follow up in 6 months.

## 2020-09-02 DIAGNOSIS — I10 Essential (primary) hypertension: Secondary | ICD-10-CM | POA: Diagnosis not present

## 2020-09-02 DIAGNOSIS — E785 Hyperlipidemia, unspecified: Secondary | ICD-10-CM | POA: Diagnosis not present

## 2020-09-02 DIAGNOSIS — R002 Palpitations: Secondary | ICD-10-CM | POA: Diagnosis not present

## 2020-09-13 ENCOUNTER — Ambulatory Visit: Payer: No Typology Code available for payment source | Admitting: Neurology

## 2020-09-24 ENCOUNTER — Other Ambulatory Visit: Payer: Self-pay | Admitting: Neurology

## 2020-09-25 DIAGNOSIS — Z20822 Contact with and (suspected) exposure to covid-19: Secondary | ICD-10-CM | POA: Diagnosis not present

## 2020-10-16 DIAGNOSIS — E785 Hyperlipidemia, unspecified: Secondary | ICD-10-CM | POA: Diagnosis not present

## 2020-11-04 DIAGNOSIS — Z411 Encounter for cosmetic surgery: Secondary | ICD-10-CM | POA: Diagnosis not present

## 2020-11-04 DIAGNOSIS — M1711 Unilateral primary osteoarthritis, right knee: Secondary | ICD-10-CM | POA: Diagnosis not present

## 2020-11-05 ENCOUNTER — Telehealth: Payer: Self-pay | Admitting: Plastic Surgery

## 2020-11-05 NOTE — Telephone Encounter (Signed)
Returned patients call, LMVM. Under the advice of Loistine Simas, Drink a lot of water in the first 24 hours, keep binder on 24/7 for 4 weeks.  Shower-avoid direct washing/spray incision. Can let soap run down from shoulders. Sleep on back, avoid heavy lifting/vigorous activity-4 weeks. May drive as long as she feels up to it and not taking pain medication. PO follow 11/15/2020. Call if she has any questions about directions.

## 2020-11-05 NOTE — Telephone Encounter (Signed)
Patient was not given in any post op instructions and wanted to find out what they are. Please call her to advise.

## 2020-11-11 ENCOUNTER — Other Ambulatory Visit: Payer: Self-pay

## 2020-11-11 ENCOUNTER — Encounter: Payer: Self-pay | Admitting: Plastic Surgery

## 2020-11-11 ENCOUNTER — Ambulatory Visit (INDEPENDENT_AMBULATORY_CARE_PROVIDER_SITE_OTHER): Payer: BC Managed Care – PPO | Admitting: Plastic Surgery

## 2020-11-11 VITALS — BP 117/73 | HR 82 | Temp 98.2°F

## 2020-11-11 DIAGNOSIS — Z719 Counseling, unspecified: Secondary | ICD-10-CM

## 2020-11-11 NOTE — Progress Notes (Signed)
The patient is a 60 year old female here for follow-up on her lateral breast liposuction.  She is doing extremely well.  She is in some pain due to her knee but that is as expected.  She has some bruising in the lateral breast area but is as expected.  There is no sign of infection.  She has a little skin blister that was likely from the TopiFoam but seems to be healing nicely.  I would like to see her back in a couple of weeks.  She knows to call with any questions or concerns.

## 2020-11-14 DIAGNOSIS — Z96651 Presence of right artificial knee joint: Secondary | ICD-10-CM | POA: Diagnosis not present

## 2020-11-15 ENCOUNTER — Encounter: Payer: No Typology Code available for payment source | Admitting: Plastic Surgery

## 2020-12-01 DIAGNOSIS — Z9889 Other specified postprocedural states: Secondary | ICD-10-CM | POA: Insufficient documentation

## 2020-12-01 NOTE — Progress Notes (Signed)
Patient is a 60 year old female here for follow-up after undergoing liposuction of lateral breasts on 11/05/2020 with Dr. Ulice Bold.  ~ 4 weeks PO Patient reports she is doing very well.  Denies fever/chills, nausea/vomiting, pain.  Lateral breast show no signs of bruising, erythema, seroma/hematoma.  Bilateral incisions have healed nicely, C/D/I.  No signs of infection, redness, drainage.  Remove suture knots today.  Continue to wear compression garment for 2 more weeks 24/7 and then can wear just during the day as needed.  Follow-up as needed.  Return precautions provided.  Call office with any questions/concerns.  Pictures were obtained of the patient and placed in the chart with the patient's or guardian's permission.

## 2020-12-03 ENCOUNTER — Other Ambulatory Visit: Payer: Self-pay

## 2020-12-03 ENCOUNTER — Encounter: Payer: Self-pay | Admitting: Plastic Surgery

## 2020-12-03 ENCOUNTER — Ambulatory Visit (INDEPENDENT_AMBULATORY_CARE_PROVIDER_SITE_OTHER): Payer: BC Managed Care – PPO | Admitting: Plastic Surgery

## 2020-12-03 VITALS — BP 145/95 | HR 65

## 2020-12-03 DIAGNOSIS — Z9889 Other specified postprocedural states: Secondary | ICD-10-CM

## 2021-02-26 DIAGNOSIS — Z96651 Presence of right artificial knee joint: Secondary | ICD-10-CM | POA: Diagnosis not present

## 2021-02-26 DIAGNOSIS — S99821A Other specified injuries of right foot, initial encounter: Secondary | ICD-10-CM | POA: Diagnosis not present

## 2021-03-04 DIAGNOSIS — M25542 Pain in joints of left hand: Secondary | ICD-10-CM | POA: Diagnosis not present

## 2021-03-04 DIAGNOSIS — E785 Hyperlipidemia, unspecified: Secondary | ICD-10-CM | POA: Diagnosis not present

## 2021-03-04 DIAGNOSIS — E559 Vitamin D deficiency, unspecified: Secondary | ICD-10-CM | POA: Diagnosis not present

## 2021-03-04 DIAGNOSIS — M25541 Pain in joints of right hand: Secondary | ICD-10-CM | POA: Diagnosis not present

## 2021-03-05 NOTE — Progress Notes (Deleted)
NEUROLOGY FOLLOW UP OFFICE NOTE  Stephanie Hall [id number]  Assessment/Plan:   1.  Migraine without aura, without status migrainosus, not intractable 2.  Disequilibrium with chronic mild residual symptoms.  Possibly vestibular neuritis.  1.  ***  Subjective:  Stephanie Hall is a 80 year oldright-handed white female with hypertension who follows up for migraine  UPDATE: Current medications:  Aimovig 70mg  Q4w, Cymbalta ***, Robaxin  Due to memory deficits, topiramate was switched to Aimovig 70mg  in November.    HISTORY: She works as an at [employer].On 10/12/2019,she was at work walking down the hall when she suddenly felt dizzy.She noted spinning vertigoand headache. She went home but the vertigo became worse and she had nausea and vomiting. She returned to the ED for evaluation. She was given meclizine and Zofran which were ineffective. Initial head CT was normal. She was transported to [facility] for an MRI. MRI brain with and without contrast were normal. Labs and EKG revealed no cardiac abnormality, anemia or electrolyte imbalance. She was diagnosed with peripheral vertigo. She felt wobbly for the next 3 days. Then on 10/21/2019, she woke up with severe pounding and squeezing bilateral retro-orbital headache as well as nausea and feeling unsteady but no vertigo. Symptoms subsided. She had a recurrence on 10/25/2019 and symptoms have persisted. She reports that her eyes feel like they are floating in syrup. She reports some blurred vision, particularly with distance and her reading glasses don't work but she denies double vision and visual obscurations. She has tinnitus but denies pulsatile tinnitus. She reports right sided aural fullness. Dizziness is aggravated by riding in a car. She feels unsteady on her feet. She finds it a little difficult to speak, like her tongue is swollen, as well as some word-finding difficulty. 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  She was placed on acetazolamide 500mg  twice daily and was started on a course of prednisone and has Valium on-hand. MRA of head on 11/20/2019 showed possible moderate proximal left M2 stenosis but. follow up CTA of head on 11/30/2019 confirmed it was artifact.  MRV of head on 11/23/2019 showed no stenosis or thrombosis.  Lumbar punctre on 11/24/2019 demonstrated normal opening pressure of 16 cm water.  Subsequently, her ophthalmologist was not convinced of the finding of papilledema.  Acetazolamide was stopped and she was started on topiramate for presumed vestibular migraines.  Still continued to have balance problems although no longer with dizziness.  Still endorses tinnitus and maybe some hearing difficulty.  She can tolerate riding in a car better.  Sudden eye movements are aggravating.  She notes headaches are often now cervicogenic, with a trigger point in the back of her neck.  Stress, alcohol and allergans are a trigger. She started experiencing short term memory problems such as recall and word-finding difficullty.  Topiramate was decreased and subsequently discontinued.  She reports history of headaches all of her life, which sound like migraines. They are normally severe pressure-like pain in the frontal region or posterior neck and occipital regions. They are associated with nausea and photophobia.  PAST MEDICAL HISTORY: Past Medical History:  Diagnosis Date  . Hypertension     MEDICATIONS: Current Outpatient Medications on File Prior to Visit  Medication Sig Dispense Refill  . b complex vitamins tablet Take 1 tablet by mouth daily.    . Diclofenac-miSOPROStol 75-0.2 MG TBEC diclofenac 75 mg-misoprostol 200 mcg tablet,immediate,delayed release    . DULoxetine (CYMBALTA)  30 MG capsule Take 30 mg by mouth daily.    [name] (AIMOVIG) 70 MG/ML SOAJ Inject 70 mg into the skin every 28 (twenty-eight) days.  1.12 mL 5  . linaclotide (LINZESS) 145 MCG CAPS capsule Take by mouth. (Patient not taking: Reported on 08/27/2020)    . meclizine (ANTIVERT) 25 MG tablet Take 1 tablet (25 mg total) by mouth 3 (three) times daily as needed for dizziness. (Patient not taking: Reported on 08/27/2020) 30 tablet 0  . methocarbamol (ROBAXIN) 500 MG tablet As needed.    . metoprolol succinate (TOPROL-XL) 50 MG 24 hr tablet Take 1 tablet by mouth 2 (two) times daily.    Marland Kitchen omeprazole (PRILOSEC) 40 MG capsule Take 1 capsule by mouth daily.    . ondansetron (ZOFRAN) 4 MG tablet Take 1 tablet (4 mg total) by mouth every 8 (eight) hours as needed for nausea or vomiting. 20 tablet 0  . OVER THE COUNTER MEDICATION Multivitamin-Take 1 table by mouth daily.    . rizatriptan (MAXALT-MLT) 10 MG disintegrating tablet As needed.    . traZODone (DESYREL) 50 MG tablet trazodone 50 mg tablet     No current facility-administered medications on file prior to visit.    ALLERGIES: Allergies  Allergen Reactions  . Adhesive  [Tape] Rash  . Contrast Media  [Iodinated Diagnostic Agents] Dermatitis, Itching and Rash  . Neosporin [Bacitracin-Polymyxin B] Hives and Rash    FAMILY HISTORY: No family history on file.    Objective:  *** General: No acute distress.  Patient appears ***-groomed.   Head:  Normocephalic/atraumatic Eyes:  Fundi examined but not visualized Neck: supple, no paraspinal tenderness, full range of motion Heart:  Regular rate and rhythm Lungs:  Clear to auscultation bilaterally Back: No paraspinal tenderness Neurological Exam: alert and oriented to person, place, and time. Attention span and concentration intact, recent and remote memory intact, fund of knowledge intact.  Speech fluent and not dysarthric, language intact.  CN II-XII intact. Bulk and tone normal, muscle strength 5/5 throughout.  Sensation to light touch, temperature and vibration intact.  Deep tendon reflexes 2+ throughout, toes downgoing.   Finger to nose and heel to shin testing intact.  Gait normal, Romberg negative.     Shon Millet, DO  CC: Adrian Prince, MD

## 2021-03-06 ENCOUNTER — Ambulatory Visit: Payer: No Typology Code available for payment source | Admitting: Neurology

## 2021-03-20 DIAGNOSIS — R42 Dizziness and giddiness: Secondary | ICD-10-CM | POA: Diagnosis not present

## 2021-03-20 DIAGNOSIS — H903 Sensorineural hearing loss, bilateral: Secondary | ICD-10-CM | POA: Diagnosis not present

## 2021-03-20 DIAGNOSIS — H60501 Unspecified acute noninfective otitis externa, right ear: Secondary | ICD-10-CM | POA: Diagnosis not present

## 2021-03-20 DIAGNOSIS — H9313 Tinnitus, bilateral: Secondary | ICD-10-CM | POA: Diagnosis not present

## 2021-03-20 DIAGNOSIS — G43809 Other migraine, not intractable, without status migrainosus: Secondary | ICD-10-CM | POA: Diagnosis not present

## 2021-04-30 DIAGNOSIS — Z13820 Encounter for screening for osteoporosis: Secondary | ICD-10-CM | POA: Diagnosis not present

## 2021-04-30 DIAGNOSIS — Z1231 Encounter for screening mammogram for malignant neoplasm of breast: Secondary | ICD-10-CM | POA: Diagnosis not present

## 2021-04-30 DIAGNOSIS — Z6830 Body mass index (BMI) 30.0-30.9, adult: Secondary | ICD-10-CM | POA: Diagnosis not present

## 2021-04-30 DIAGNOSIS — Z01419 Encounter for gynecological examination (general) (routine) without abnormal findings: Secondary | ICD-10-CM | POA: Diagnosis not present

## 2021-05-08 NOTE — Progress Notes (Signed)
NEUROLOGY FOLLOW UP OFFICE NOTE  Stephanie Hall [id number]  Assessment/Plan:   Migraine without aura, without status migrainosus, not intractable Ocular migraine Disequilibrium.  Unclear ir residual symptoms from previous vestibular neuritis or migrainous.  Migraine prevention:  Due to frequent changes in insurance companies' formularies this year, I have asked Olegario Messier to contact her insurance for their preferred CGRP inhibitor:  Aimovig, Emgality, Ajovy.  She will contact me with their response. Migraine rescue:  Advised to use Maxalt-MLT 10mg  as first line at earliest onset as she does not have frequent migraines without risk of running out or rebound headache. Advised to treat current headache now before it progresses to full migraine. Limit use of pain relievers to no more than 2 days out of week to prevent risk of rebound or medication-overuse headache. Keep headache diary Follow up 6 months.   Subjective:  Stephanie Hall is a 60 year old right-handed white female with hypertension who follows up for vertigo.  MRV, MRA and CTA of head personally reviewed.   UPDATE: Current medications:  Aimovig 70mg  Q4wks, rizatriptan 10mg , ibuprofen, Excedrin Migraine, duloxetine 30mg  daily  last migraine 2 months ago 4 times a month - mid-frontal - a few hours sleep 12 hours most.  Prescribed Aimovig in November.  However she never started it until last week because she had been doing well.  Dizziness subsequently resolved.  She would have a migraine about 4 times a month, lasting a few hours.  Usually treats with Excedrin but may take rizatriptan if does not respond.  Last week, she had 3 days of ocular migraines lasting 20 minutes each.  She took the Aimovig.  Today, she feels a little dizzy with mild mid-frontal headache.  However, she didn't take her medication this morning.     HISTORY: She works as an at .  On 10/12/2019, she was at work walking down the hall when she  suddenly felt dizzy.  She noted spinning vertigo and headache.  She went home but the vertigo became worse and she had nausea and vomiting.  She returned to the ED for evaluation.  She was given meclizine and Zofran which were ineffective.  Initial head CT was normal.  She was transported to [facility] for an MRI.  MRI brain with and without contrast were normal. Labs and EKG revealed no cardiac abnormality, anemia or electrolyte imbalance.  She was diagnosed with peripheral vertigo.  She felt wobbly for the next 3 days.  Then on 10/21/2019, she woke up with severe pounding and squeezing bilateral retro-orbital headache as well as nausea and feeling unsteady but no vertigo.  Symptoms subsided.  She had a recurrence on 10/25/2019 and symptoms have persisted.  She reports that her eyes feel like they are floating in syrup.  She reports some blurred vision, particularly with distance and her reading glasses don't work but she denies double vision and visual obscurations.  She has tinnitus but denies pulsatile tinnitus.  She reports right sided aural fullness.  Dizziness is aggravated by riding in a car.  She feels unsteady on her feet.  She finds it a little difficult to speak, like her tongue is swollen, as well as some word-finding difficulty.  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  She was placed on acetazolamide 500mg  twice daily and was started on a course of prednisone and has Valium on-hand.  I spoke with patient's ophthalmologist, Dr. [facility],  who in retrospect wasn't convinced that she definitely had papilledema on exam.  I still wanted to proceed with workup with MRA/MRV of head and lumbar puncture.  However, thinking that her vertigo may be migraine, I wanted her to switch from acetazolamide to topiramate 25mg  twice daily.  MRA of head on 11/20/2019 showed possible moderate proximal left M2 stenosis but. follow up CTA of head on 11/30/2019  confirmed it was artifact.  MRV of head on 11/23/2019 showed no stenosis or thrombosis.  Lumbar punctre on 11/24/2019 demonstrated normal opening pressure of 16 cm water.   She reports history of headaches all of her life, which sound like migraines.  They are normally severe pressure-like pain in the frontal region or posterior neck and occipital regions.  They are associated with nausea and photophobia.  She also has history of ocular migraines presenting as scintillating scotoma lasting 20 minutes.  Past medications:  topiramate (cognitive deficits)  PAST MEDICAL HISTORY: Past Medical History:  Diagnosis Date   Hypertension     MEDICATIONS: Current Outpatient Medications on File Prior to Visit  Medication Sig Dispense Refill   b complex vitamins tablet Take 1 tablet by mouth daily.     Diclofenac-miSOPROStol 75-0.2 MG TBEC diclofenac 75 mg-misoprostol 200 mcg tablet,immediate,delayed release     DULoxetine (CYMBALTA) 30 MG capsule Take 30 mg by mouth daily.     Erenumab-aooe (AIMOVIG) 70 MG/ML SOAJ Inject 70 mg into the skin every 28 (twenty-eight) days. 1.12 mL 5   linaclotide (LINZESS) 145 MCG CAPS capsule Take by mouth. (Patient not taking: Reported on 08/27/2020)     meclizine (ANTIVERT) 25 MG tablet Take 1 tablet (25 mg total) by mouth 3 (three) times daily as needed for dizziness. (Patient not taking: Reported on 08/27/2020) 30 tablet 0   methocarbamol (ROBAXIN) 500 MG tablet As needed.     metoprolol succinate (TOPROL-XL) 50 MG 24 hr tablet Take 1 tablet by mouth 2 (two) times daily.     omeprazole (PRILOSEC) 40 MG capsule Take 1 capsule by mouth daily.     ondansetron (ZOFRAN) 4 MG tablet Take 1 tablet (4 mg total) by mouth every 8 (eight) hours as needed for nausea or vomiting. 20 tablet 0   OVER THE COUNTER MEDICATION Multivitamin-Take 1 table by mouth daily.     rizatriptan (MAXALT-MLT) 10 MG disintegrating tablet As needed.     traZODone (DESYREL) 50 MG tablet trazodone 50 mg  tablet     No current facility-administered medications on file prior to visit.    ALLERGIES: Allergies  Allergen Reactions   Adhesive  [Tape] Rash   Contrast Media  [Iodinated Diagnostic Agents] Dermatitis, Itching and Rash   Neosporin [Bacitracin-Polymyxin B] Hives and Rash    FAMILY HISTORY: No family history on file.    Objective:  Blood pressure 136/85, pulse 68, height 5\' 4"  (1.626 m), weight 173 lb 12.8 oz (78.8 kg), SpO2 96 %. General: No acute distress.  Patient appears well-groomed.    08/29/2020, DO  CC: , MD

## 2021-05-09 ENCOUNTER — Ambulatory Visit (INDEPENDENT_AMBULATORY_CARE_PROVIDER_SITE_OTHER): Payer: BC Managed Care – PPO | Admitting: Neurology

## 2021-05-09 ENCOUNTER — Encounter: Payer: Self-pay | Admitting: Neurology

## 2021-05-09 ENCOUNTER — Other Ambulatory Visit: Payer: Self-pay

## 2021-05-09 VITALS — BP 136/85 | HR 68 | Ht 64.0 in | Wt 173.8 lb

## 2021-05-09 DIAGNOSIS — G43109 Migraine with aura, not intractable, without status migrainosus: Secondary | ICD-10-CM

## 2021-05-09 DIAGNOSIS — H814 Vertigo of central origin: Secondary | ICD-10-CM

## 2021-05-09 DIAGNOSIS — G43009 Migraine without aura, not intractable, without status migrainosus: Secondary | ICD-10-CM | POA: Diagnosis not present

## 2021-05-09 MED ORDER — RIZATRIPTAN BENZOATE 10 MG PO TBDP: ORAL_TABLET | ORAL | 5 refills | Status: DC

## 2021-05-09 NOTE — Patient Instructions (Signed)
Contact [employer] to find out which of the following medications are preferred:  Aimovig, Emgality, Ajovy.  Let me know and I will send a prescription Refilled rizatriptan disintegrating tablet - Limit use of pain relievers to no more than 2 days out of week to prevent risk of rebound or medication-overuse headache. Keep migraine diary Follow up 6 months.

## 2021-05-25 ENCOUNTER — Other Ambulatory Visit: Payer: Self-pay | Admitting: Neurology

## 2021-05-26 ENCOUNTER — Other Ambulatory Visit: Payer: Self-pay | Admitting: Neurology

## 2021-05-26 MED ORDER — AIMOVIG 140 MG/ML ~~LOC~~ SOAJ: 140.0000 mg | SUBCUTANEOUS | 1 refills | Status: DC

## 2021-06-17 DIAGNOSIS — H903 Sensorineural hearing loss, bilateral: Secondary | ICD-10-CM | POA: Diagnosis not present

## 2021-06-17 DIAGNOSIS — Z461 Encounter for fitting and adjustment of hearing aid: Secondary | ICD-10-CM | POA: Diagnosis not present

## 2021-06-19 DIAGNOSIS — R200 Anesthesia of skin: Secondary | ICD-10-CM | POA: Diagnosis not present

## 2021-06-19 DIAGNOSIS — R2230 Localized swelling, mass and lump, unspecified upper limb: Secondary | ICD-10-CM | POA: Diagnosis not present

## 2021-06-19 DIAGNOSIS — M13841 Other specified arthritis, right hand: Secondary | ICD-10-CM | POA: Diagnosis not present

## 2021-07-16 DIAGNOSIS — X32XXXS Exposure to sunlight, sequela: Secondary | ICD-10-CM | POA: Diagnosis not present

## 2021-07-16 DIAGNOSIS — L720 Epidermal cyst: Secondary | ICD-10-CM | POA: Diagnosis not present

## 2021-07-16 DIAGNOSIS — D229 Melanocytic nevi, unspecified: Secondary | ICD-10-CM | POA: Diagnosis not present

## 2021-07-16 DIAGNOSIS — L814 Other melanin hyperpigmentation: Secondary | ICD-10-CM | POA: Diagnosis not present

## 2021-09-26 DIAGNOSIS — R300 Dysuria: Secondary | ICD-10-CM | POA: Diagnosis not present

## 2021-11-13 ENCOUNTER — Encounter: Payer: Self-pay | Admitting: Physician Assistant

## 2021-11-13 ENCOUNTER — Emergency Department (HOSPITAL_BASED_OUTPATIENT_CLINIC_OR_DEPARTMENT_OTHER)
Admission: EM | Admit: 2021-11-13 | Discharge: 2021-11-13 | Disposition: A | Discharge status: Home / Self Care | Payer: BC Managed Care – PPO | Attending: Emergency Medicine | Admitting: Emergency Medicine

## 2021-11-13 ENCOUNTER — Encounter (HOSPITAL_BASED_OUTPATIENT_CLINIC_OR_DEPARTMENT_OTHER): Payer: Self-pay | Admitting: *Deleted

## 2021-11-13 ENCOUNTER — Emergency Department (HOSPITAL_BASED_OUTPATIENT_CLINIC_OR_DEPARTMENT_OTHER): Payer: BC Managed Care – PPO

## 2021-11-13 ENCOUNTER — Other Ambulatory Visit: Payer: Self-pay

## 2021-11-13 DIAGNOSIS — R072 Precordial pain: Secondary | ICD-10-CM

## 2021-11-13 DIAGNOSIS — R61 Generalized hyperhidrosis: Secondary | ICD-10-CM | POA: Insufficient documentation

## 2021-11-13 DIAGNOSIS — R110 Nausea: Secondary | ICD-10-CM | POA: Diagnosis not present

## 2021-11-13 DIAGNOSIS — R0789 Other chest pain: Secondary | ICD-10-CM | POA: Diagnosis not present

## 2021-11-13 DIAGNOSIS — I10 Essential (primary) hypertension: Secondary | ICD-10-CM | POA: Insufficient documentation

## 2021-11-13 DIAGNOSIS — Z79899 Other long term (current) drug therapy: Secondary | ICD-10-CM | POA: Insufficient documentation

## 2021-11-13 LAB — BASIC METABOLIC PANEL
Anion gap: 8 (ref 5–15)
BUN: 18 mg/dL (ref 8–23)
CO2: 30 mmol/L (ref 22–32)
Calcium: 9.6 mg/dL (ref 8.9–10.3)
Chloride: 101 mmol/L (ref 98–111)
Creatinine, Ser: 0.72 mg/dL (ref 0.44–1.00)
GFR, Estimated: >60 mL/min (ref >60)
Glucose, Bld: 94 mg/dL (ref 70–99)
Potassium: 3.4 mmol/L — ABNORMAL LOW (ref 3.5–5.1)
Sodium: 139 mmol/L (ref 135–145)

## 2021-11-13 LAB — CBC
HCT: 42.8 % (ref 36.0–46.0)
Hemoglobin: 14.5 g/dL (ref 12.0–15.0)
MCH: 31.5 pg (ref 26.0–34.0)
MCHC: 33.9 g/dL (ref 30.0–36.0)
MCV: 93 fL (ref 80.0–100.0)
Platelets: 333 10*3/uL (ref 150–400)
RBC: 4.6 MIL/uL (ref 3.87–5.11)
RDW: 12.6 % (ref 11.5–15.5)
WBC: 10.5 10*3/uL (ref 4.0–10.5)
nRBC: 0 % (ref 0.0–0.2)

## 2021-11-13 LAB — TROPONIN I (HIGH SENSITIVITY)
Troponin I (High Sensitivity): <2 ng/L (ref <18)
Troponin I (High Sensitivity): <2 ng/L (ref <18)

## 2021-11-13 MED ORDER — ASPIRIN 81 MG PO CHEW
324.0000 mg | CHEWABLE_TABLET | Freq: Once | ORAL | Status: AC
  Administered 2021-11-13: 324 mg via ORAL
  Filled 2021-11-13: qty 4

## 2021-11-13 NOTE — Discharge Instructions (Addendum)
We saw you in the ER for the chest pain/shortness of breath. All of our cardiac workup is normal, including labs, EKG and chest X-RAY are normal. Still, we are concerned about the type of chest pain you had and the risk factors you have for coronary artery disease.  Therefore, we had called cardiology team who have informed us that they will call you with an appointment tomorrow.  If you do not hear from them by 1 PM, please call the number provided to set up an appointment for early next week.  Please return to the ER if you have worsening chest pain, shortness of breath, pain radiating to your jaw, shoulder, or back, sweats or fainting.   Start taking baby aspirin every day until you are seen by cardiologist.

## 2021-11-13 NOTE — ED Provider Notes (Signed)
[facility] EMERGENCY DEPARTMENT Provider Note   CSN: [id number] Arrival date & time: 11/13/21  1649     History  Chief Complaint  Patient presents with   Chest Pain    Valari Whitler is a 61 y.o. female.  HPI  HPI: A 61 year old patient with a history of hypertension, hypercholesterolemia and obesity presents for evaluation of chest pain. Initial onset of pain was approximately 3-6 hours ago. The patient's chest pain is described as heaviness/pressure/tightness and is not worse with exertion. The patient complains of nausea and reports some diaphoresis. The patient's chest pain is middle- or left-sided, is not well-localized, is not sharp and does radiate to the arms/jaw/neck. The patient has a family history of coronary artery disease in a first-degree relative with onset less than age 23. The patient has no history of stroke, has no history of peripheral artery disease, has not smoked in the past 90 days and denies any history of treated diabetes.   Chest pain, back pain radiating up her neck towards her jaw while at [facility] for about 30 seconds.  She had associated nausea and diaphoresis.  No chest pain or shortness of breath over the last few days with exertion.  No repeat episodes since that initial episode.   Home Medications Prior to Admission medications   Medication Sig Start Date End Date Taking? Authorizing Provider  b complex vitamins tablet Take 1 tablet by mouth daily.    [provider]  Diclofenac-miSOPROStol 75-0.2 MG TBEC diclofenac 75 mg-misoprostol 200 mcg tablet,immediate,delayed release 02/22/19   [provider]  DULoxetine (CYMBALTA) 30 MG capsule Take 30 mg by mouth daily.    [provider]  Erenumab-aooe (AIMOVIG) 140 MG/ML SOAJ Inject 140 mg into the skin every 28 (twenty-eight) days. 05/26/21   Pieter Partridge, DO  linaclotide Rolan Lipa) 145 MCG CAPS capsule Take by mouth. 10/02/19   [provider]  methocarbamol  (ROBAXIN) 500 MG tablet As needed.    [provider]  metoprolol succinate (TOPROL-XL) 50 MG 24 hr tablet Take 1 tablet by mouth 2 (two) times daily.    [provider]  omeprazole (PRILOSEC) 40 MG capsule Take 1 capsule by mouth daily. 03/27/19 03/26/20  [provider]  OVER THE COUNTER MEDICATION Multivitamin-Take 1 table by mouth daily.    [provider]  rizatriptan (MAXALT-MLT) 10 MG disintegrating tablet Take 1 tablet earliest onset of migraine.  May repeat in 2 hours if needed. Maximum 2 tablets in 24 hours. 05/09/21   Pieter Partridge, DO  traZODone (DESYREL) 50 MG tablet trazodone 50 mg tablet    [provider]      Allergies    Adhesive  [tape], Contrast media  [iodinated contrast media], and Neosporin [bacitracin-polymyxin b]    Review of Systems   Review of Systems  All other systems reviewed and are negative.  Physical Exam Updated Vital Signs BP 131/80    Pulse 65    Temp 97.8 F (36.6 C) (Oral)    Resp 13    Ht 5\' 4"  (1.626 m)    Wt 83.5 kg    SpO2 99%    BMI 31.58 kg/m  Physical Exam Vitals and nursing note reviewed.  Constitutional:      Appearance: She is well-developed.  HENT:     Head: Atraumatic.  Cardiovascular:     Rate and Rhythm: Normal rate.     Heart sounds: Normal heart sounds.  Pulmonary:     Effort: Pulmonary  effort is normal.  Musculoskeletal:     Cervical back: Normal range of motion and neck supple.     Right lower leg: No tenderness. No edema.     Left lower leg: No tenderness. No edema.  Skin:    General: Skin is warm and dry.  Neurological:     Mental Status: She is alert and oriented to person, place, and time.    ED Results / Procedures / Treatments   Labs (all labs ordered are listed, but only abnormal results are displayed) Labs Reviewed  BASIC METABOLIC PANEL - Abnormal; Notable for the following components:      Result Value   Potassium 3.4 (*)    All other components within normal  limits  CBC  TROPONIN I (HIGH SENSITIVITY)  TROPONIN I (HIGH SENSITIVITY)    EKG EKG Interpretation  Date/Time:  Thursday November 13 2021 17:01:31 EST Ventricular Rate:  71 PR Interval:  147 QRS Duration: 90 QT Interval:  393 QTC Calculation: 428 R Axis:   38 Text Interpretation: Sinus rhythm No acute changes normal axis normal intervals No significant change since last tracing Confirmed by Varney Biles [phone]) on 11/13/2021 5:04:07 PM  Radiology DG Chest 2 View  Result Date: 11/13/2021 CLINICAL DATA:  Provided history: Chest pressure. Sudden onset pressure between shoulder blades, diaphoresis, nausea and jaw pain. EXAM: CHEST - 2 VIEW COMPARISON:  Prior chest radiographs 02/02/2017 and earlier. FINDINGS: Heart size within normal limits. No appreciable airspace consolidation. No evidence of pleural effusion or pneumothorax. No acute bony abnormality identified. Degenerative changes of the spine. Thoracic dextrocurvature. Widening of the right acromioclavicular joint suggesting prior right AC joint injury. IMPRESSION: No evidence of acute cardiopulmonary abnormality. Thoracic dextrocurvature. Degenerative changes of the spine. Widening of the right acromioclavicular joint suggesting prior right AC joint injury. Electronically Signed   By: Kellie Simmering D.O.   On: 11/13/2021 17:28    Procedures Procedures    Medications Ordered in ED Medications  aspirin chewable tablet 324 mg (has no administration in time range)    ED Course/ Medical Decision Making/ A&P   HEAR Score: 5                       Medical Decision Making Differential diagnosis includes: ACS syndrome Aortic dissection CHF exacerbation Valvular disorder Myocarditis Pericarditis Endocarditis Pericardial effusion / tamponade Pneumonia Pleural effusion / Pulmonary edema PE Pneumothorax Musculoskeletal pain PUD / Gastritis / Esophagitis Esophageal spasm  Patient with multiple ACS risk factors and heart score  of 5 comes in with chief complaint of chest pain.  Chest pain had some typical features.   Differential diagnosis primarily includes ACS.  Delta troponins were ordered along with basic labs and EKG.  Patient is EKG is reassuring.  Normal sinus rhythm without any clear evidence of ischemia.  Chest x-ray was reviewed independently, there is no evidence of pneumothorax or pleural effusion.  Troponins are also below institutional cutoff for ischemia.  That being said, patient had some typical sounding chest pain and has risk factors.  Therefore case discussed with Dr. Nechama Guard, cardiology.  Patient was given the option of being transferred to [facility] for coronary CT versus outpatient follow-up with cardiology to get the appropriate provocative testing done.  Patient prefers the latter.  She understands that with this choice, if her appointment is sometime next week, she might have to come back to the ER if she starts having chest pain again.  If troponins are negative  then patient will be discharged with strict ER return precautions.  Problems Addressed: Precordial chest pain: acute illness or injury with systemic symptoms that poses a threat to life or bodily functions  Amount and/or Complexity of Data Reviewed Labs: ordered. Radiology: ordered.  Risk OTC drugs. Decision regarding hospitalization.    Final Clinical Impression(s) / ED Diagnoses Final diagnoses:  Precordial chest pain    Rx / DC Orders ED Discharge Orders     None         Varney Biles, MD 11/13/21 2006

## 2021-11-13 NOTE — ED Triage Notes (Signed)
Sudden onset of pressure between her shoulder blades, diaphoresis, nausea and jaw pain. Chest pain. EKG at bedside.

## 2021-11-13 NOTE — Progress Notes (Signed)
Dr. Gardiner Rhyme fielded call about this patient with chest pain. She was being ruled out and it was anticipated she will be discharged from the ER. Dr. Gardiner Rhyme requested help arranging f/u for her. He relayed to the ER that the office would arrange early outpatient f/u in the [facility] location if possible. I was not sure we had close availability so have sent a message to both the [facility] scheduling pool as well as [facility] requesting an early new pt appointment, and our office will call the patient with this information.

## 2021-11-18 ENCOUNTER — Ambulatory Visit (INDEPENDENT_AMBULATORY_CARE_PROVIDER_SITE_OTHER): Payer: BC Managed Care – PPO | Admitting: Cardiology

## 2021-11-18 ENCOUNTER — Other Ambulatory Visit: Payer: Self-pay

## 2021-11-18 VITALS — BP 122/90 | HR 73 | Ht 64.0 in | Wt 183.0 lb

## 2021-11-18 DIAGNOSIS — R079 Chest pain, unspecified: Secondary | ICD-10-CM | POA: Diagnosis not present

## 2021-11-18 DIAGNOSIS — R072 Precordial pain: Secondary | ICD-10-CM

## 2021-11-18 DIAGNOSIS — R002 Palpitations: Secondary | ICD-10-CM

## 2021-11-18 DIAGNOSIS — Z8249 Family history of ischemic heart disease and other diseases of the circulatory system: Secondary | ICD-10-CM

## 2021-11-18 DIAGNOSIS — I10 Essential (primary) hypertension: Secondary | ICD-10-CM

## 2021-11-18 DIAGNOSIS — Z7189 Other specified counseling: Secondary | ICD-10-CM

## 2021-11-18 MED ORDER — PREDNISONE 50 MG PO TABS: ORAL_TABLET | ORAL | 0 refills | Status: DC

## 2021-11-18 MED ORDER — DIPHENHYDRAMINE HCL 50 MG PO TABS: 50.0000 mg | ORAL_TABLET | Freq: Once | ORAL | 0 refills | Status: DC

## 2021-11-18 NOTE — Patient Instructions (Addendum)
Medication Instructions:  Your Physician recommend you continue on your current medication as directed.    *If you need a refill on your cardiac medications before your next appointment, please call your pharmacy*   Lab Work: None ordered today   Testing/Procedures: Cardiac CT Angiography (CTA), is a special type of CT scan that uses a computer to produce multi-dimensional views of major blood vessels throughout the body. In CT angiography, a contrast material is injected through an IV to help visualize the blood vessels [facility]   Follow-Up: At [facility], you and your health needs are our priority.  As part of our continuing mission to provide you with exceptional heart care, we have created designated Provider Care Teams.  These Care Teams include your primary Cardiologist (physician) and Advanced Practice Providers (APPs -  Physician Assistants and Nurse Practitioners) who all work together to provide you with the care you need, when you need it.  We recommend signing up for the patient portal called "MyChart".  Sign up information is provided on this After Visit Summary.  MyChart is used to connect with patients for Virtual Visits (Telemedicine).  Patients are able to view lab/test results, encounter notes, upcoming appointments, etc.  Non-urgent messages can be sent to your provider as well.   To learn more about what you can do with MyChart, go to [email or web address].    Your next appointment:   As needed  The format for your next appointment:   In Person  Provider:   Jodelle Red, MD    Other Instructions   Your cardiac CT will be scheduled at one of the below locations:   [facility] [address] [place], [place] [address] [phone]   If scheduled at [facility], please arrive at the [facility] main entrance (entrance A) of [facility] 30 minutes prior to test start time. You can use the FREE valet  parking offered at the main entrance (encouraged to control the heart rate for the test) Proceed to the [facility] Radiology Department (first floor) to check-in and test prep.  If scheduled at [facility], please arrive 15 mins early for check-in and test prep.  Please follow these instructions carefully (unless otherwise directed):   On the Night Before the Test: Be sure to Drink plenty of water. Do not consume any caffeinated/decaffeinated beverages or chocolate 12 hours prior to your test. Do not take any antihistamines 12 hours prior to your test. If the patient has contrast allergy: Patient will need a prescription for Prednisone and very clear instructions (as follows): Prednisone 50 mg - take 13 hours prior to test Take another Prednisone 50 mg 7 hours prior to test Take another Prednisone 50 mg 1 hour prior to test Take Benadryl 50 mg 1 hour prior to test Patient must complete all four doses of above prophylactic medications. Patient will need a ride after test due to Benadryl.  On the Day of the Test: Drink plenty of water until 1 hour prior to the test. Do not eat any food 4 hours prior to the test. You may take your regular medications prior to the test.  The day of the CT, Take 100 mg metoprolol succinate in the morning and 50 mg at night. HOLD Hydrochlorothiazide morning of the test. FEMALES- please wear underwire-free bra if available, avoid dresses & tight clothing       After the Test: Drink plenty of water. After receiving IV contrast, you  may experience a mild flushed feeling. This is normal. On occasion, you may experience a mild rash up to 24 hours after the test. This is not dangerous. If this occurs, you can take Benadryl 25 mg and increase your fluid intake. If you experience trouble breathing, this can be serious. If it is severe call 911 IMMEDIATELY. If it is mild, please call our office. If you take any of these medications:  Glipizide/Metformin, Avandament, Glucavance, please do not take 48 hours after completing test unless otherwise instructed.  We will call to schedule your test 2-4 weeks out understanding that some insurance companies will need an authorization prior to the service being performed.   For non-scheduling related questions, please contact the cardiac imaging nurse navigator should you have any questions/concerns: Rockwell Alexandria, Cardiac Imaging Nurse Navigator Larey Brick, Cardiac Imaging Nurse Navigator [facility] Direct Office Dial: [phone]   For scheduling needs, including cancellations and rescheduling, please call [place], [phone].

## 2021-11-18 NOTE — Progress Notes (Signed)
Cardiology Office Note:    Date:  11/18/2021   ID:  Stephanie Hall, DOB 1961/07/25, MRN [id number]  PCP:  Reynold Bowen, MD  Cardiologist:  Buford Dresser, MD  Referring MD: Reynold Bowen, MD   CC: new patient consultation for chest pain  History of Present Illness:    Stephanie Hall is a 61 y.o. female with a hx of hypertension, hypercholesterolemia, GERD who is seen as a new consult at the request of Reynold Bowen, MD for the evaluation and management of chest pain.  She presented to the [facility] ER on 11/13/21. Note and workup reviewed. ECG unremarkable, hsTn unremarkable. Referred to cardiology for further evaluation.  No further chest pain in the ER. Has rare palpitations, managed chronically with beta blocker.  Events leading to ER: Was at [facility], at the pharmacy counter. Suddenly felt a squeezing from her shoulderblades through to her front chest. Felt short of breath, sweaty. Lasted less than 30 seconds. The following day she was sore. Has never had episode like that before. Presented to ER.   Cardiovascular risk factors: Prior clinical ASCVD: none Comorbid conditions: hypertension never specifically mentioned but on beta blocker for palpitations, started HCTZ 04/2021, hyperlipidemia (tried statins, had muscle aches within a day; believes it was atorvastatin), diabetes, chronic kidney disease. Has been on beta blocker for palpitations [id number] years Metabolic syndrome/Obesity: current Chronic inflammatory conditions: none Tobacco use history: never Family history: father had HTN his entire adult life, had CVA in his 65s, 4V CABG in his 43s, afib, had CVA in 17-Feb-2018 at age 2 and passed away. Mother had CHF, nonischemic, had ICD, lived age 63 and then passed from pancreatic cancer. All 4 grandparents had heart/vascular issues (AAA, CVA, MI). Lost her brother and sister to cancer, living brother recently had stroke. Prior pertinent testing and/or incidental findings:  calcium score 03/21/20 was 0. Exercise level: working 3 days/week, walks 3-4 miles/day at work.  Current diet: eats generally health, avoids fried food  Past Medical History:  Diagnosis Date   Hypertension     Past Surgical History:  Procedure Laterality Date   ROTATOR CUFF REPAIR      Current Medications: Current Outpatient Medications on File Prior to Visit  Medication Sig   b complex vitamins tablet Take 1 tablet by mouth daily.   Diclofenac-miSOPROStol 75-0.2 MG TBEC diclofenac 75 mg-misoprostol 200 mcg tablet,immediate,delayed release   DULoxetine (CYMBALTA) 30 MG capsule Take 30 mg by mouth daily.   Erenumab-aooe (AIMOVIG) 140 MG/ML SOAJ Inject 140 mg into the skin every 28 (twenty-eight) days.   linaclotide (LINZESS) 145 MCG CAPS capsule Take by mouth.   methocarbamol (ROBAXIN) 500 MG tablet As needed.   metoprolol succinate (TOPROL-XL) 50 MG 24 hr tablet Take 1 tablet by mouth 2 (two) times daily.   omeprazole (PRILOSEC) 40 MG capsule Take 1 capsule by mouth daily.   OVER THE COUNTER MEDICATION Multivitamin-Take 1 table by mouth daily.   rizatriptan (MAXALT-MLT) 10 MG disintegrating tablet Take 1 tablet earliest onset of migraine.  May repeat in 2 hours if needed. Maximum 2 tablets in 24 hours.   traZODone (DESYREL) 50 MG tablet trazodone 50 mg tablet   No current facility-administered medications on file prior to visit.     Allergies:   Adhesive  [tape], Contrast media  [iodinated contrast media], and Neosporin [bacitracin-polymyxin b]   Social History   Tobacco Use   Smoking status: Never   Smokeless tobacco: Never  Substance Use Topics   Alcohol use: Not  Currently   Drug use: Not Currently    Family History: father had HTN his entire adult life, had CVA in his 24s, 4V CABG in his 92s, afib, had CVA in 02/08/18 at age 70 and passed away. Mother had CHF, nonischemic, had ICD, lived age 30 and then passed from pancreatic cancer. All 4 grandparents had heart/vascular  issues (AAA, CVA, MI). Lost her brother and sister to cancer, living brother recently had stroke.  ROS:   Please see the history of present illness.  Additional pertinent ROS: Constitutional: Negative for chills, fever, night sweats, unintentional weight loss  HENT: Negative for ear pain and hearing loss.   Eyes: Negative for loss of vision and eye pain.  Respiratory: Negative for cough, sputum, wheezing.   Cardiovascular: See HPI. Gastrointestinal: Negative for abdominal pain, melena, and hematochezia.  Genitourinary: Negative for dysuria and hematuria.  Musculoskeletal: Negative for falls and myalgias.  Skin: Negative for itching and rash.  Neurological: Negative for focal weakness, focal sensory changes and loss of consciousness.  Endo/Heme/Allergies: Does not bruise/bleed easily.     EKGs/Labs/Other Studies Reviewed:    The following studies were reviewed today: No prior cardiac studies  EKG:  EKG is personally reviewed.   11/18/21: NSR at 73 bpm  Recent Labs: 11/13/2021: BUN 18; Creatinine, Ser 0.72; Hemoglobin 14.5; Platelets 333; Potassium 3.4; Sodium 139  Recent Lipid Panel No results found for: CHOL, TRIG, HDL, CHOLHDL, VLDL, LDLCALC, LDLDIRECT  Physical Exam:    VS:  BP 122/90 (BP Location: Left Arm, Patient Position: Sitting, Cuff Size: Large)    Pulse 73    Ht 5\' 4"  (1.626 m)    Wt 183 lb (83 kg)    BMI 31.41 kg/m     Wt Readings from Last 3 Encounters:  11/18/21 183 lb (83 kg)  11/13/21 184 lb (83.5 kg)  05/09/21 173 lb 12.8 oz (78.8 kg)    GEN: Well nourished, well developed in no acute distress HEENT: Normal, moist mucous membranes NECK: No JVD CARDIAC: regular rhythm, normal S1 and S2, no rubs or gallops. No murmur. VASCULAR: Radial and DP pulses 2+ bilaterally. No carotid bruits RESPIRATORY:  Clear to auscultation without rales, wheezing or rhonchi  ABDOMEN: Soft, non-tender, non-distended MUSCULOSKELETAL:  Ambulates independently SKIN: Warm and dry, no  edema NEUROLOGIC:  Alert and oriented x 3. No focal neuro deficits noted. PSYCHIATRIC:  Normal affect    ASSESSMENT:    1. Chest pain of uncertain etiology   2. Essential hypertension   3. Heart palpitations   4. Family history of heart disease   5. Cardiac risk counseling   6. Counseling on health promotion and disease prevention   7. Precordial pain    PLAN:    Chest pain Family history of heart disease -discussed treadmill stress, nuclear stress/lexiscan, and CT coronary angiography. Discussed pros and cons of each, including but not limited to false positive/false negative risk, radiation risk, and risk of IV contrast dye. Based on shared decision making, decision was made to pursue CT coronary angiography. -BMET 11/13/21 -counseled on use of sublingual nitroglycerin and its importance to a good test  -Takes metoprolol 50 in the morning and 100 at night--will have her change this to 100 mg in the morning at 50 at night the day of her test -will premed as it is unclear whether her reaction was to CT dye or MRI dye  Hypertension -systolic at goal, diastolic elevated today -continue to monitor -continue HCTZ -on metoprolol for palpitations, though  this is not likely affecting her BP signficiantly -May benefit from ARB or amlodipine if needed in the future  Palpitations -chronically managed with beta blocker for >20 years -ECG unremarkable today  Cardiac risk counseling and prevention recommendations: -recommend heart healthy/Mediterranean diet, with whole grains, fruits, vegetable, fish, lean meats, nuts, and olive oil. Limit salt. -recommend moderate walking, 3-5 times/week for 30-50 minutes each session. Aim for at least 150 minutes.week. Goal should be pace of 3 miles/hours, or walking 1.5 miles in 30 minutes -recommend avoidance of tobacco products. Avoid excess alcohol. -ASCVD risk score: The ASCVD Risk score (Arnett DK, et al., 2019) failed to calculate for the following  reasons:   Cannot find a previous HDL lab   Cannot find a previous total cholesterol lab    Plan for follow up: to be determined based on results of testing  Buford Dresser, MD, PhD, [facility]    Medication Adjustments/Labs and Tests Ordered: Current medicines are reviewed at length with the patient today.  Concerns regarding medicines are outlined above.  Orders Placed This Encounter  Procedures   CT CORONARY MORPH W/CTA COR W/SCORE W/CA W/CM &/OR WO/CM   EKG 12-Lead   Meds ordered this encounter  Medications   diphenhydrAMINE (BENADRYL) 50 MG tablet    Sig: Take 1 tablet (50 mg total) by mouth once for 1 dose. 1 hour prior to test    Dispense:  1 tablet    Refill:  0   predniSONE (DELTASONE) 50 MG tablet    Sig: Take 1 tablet by mouth 13 hrs prior to test, another tablet 7 hours prior, and last dose 1 hour prior.    Dispense:  3 tablet    Refill:  0    Patient Instructions  Medication Instructions:  Your Physician recommend you continue on your current medication as directed.    *If you need a refill on your cardiac medications before your next appointment, please call your pharmacy*   Lab Work: None ordered today   Testing/Procedures: Cardiac CT Angiography (CTA), is a special type of CT scan that uses a computer to produce multi-dimensional views of major blood vessels throughout the body. In CT angiography, a contrast material is injected through an IV to help visualize the blood vessels [facility]   Follow-Up: At [facility], you and your health needs are our priority.  As part of our continuing mission to provide you with exceptional heart care, we have created designated Provider Care Teams.  These Care Teams include your primary Cardiologist (physician) and Advanced Practice Providers (APPs -  Physician Assistants and Nurse Practitioners) who all work together to provide you with the care you need, when you need  it.  We recommend signing up for the patient portal called "MyChart".  Sign up information is provided on this After Visit Summary.  MyChart is used to connect with patients for Virtual Visits (Telemedicine).  Patients are able to view lab/test results, encounter notes, upcoming appointments, etc.  Non-urgent messages can be sent to your provider as well.   To learn more about what you can do with MyChart, go to [email or web address].    Your next appointment:   As needed  The format for your next appointment:   In Person  Provider:   Buford Dresser, MD    Other Instructions   Your cardiac CT will be scheduled at one of the below locations:   [facility] [place],  [place] [address] [phone]   If scheduled at [facility], please arrive at the [facility] main entrance (entrance A) of [facility] 30 minutes prior to test start time. You can use the FREE valet parking offered at the main entrance (encouraged to control the heart rate for the test) Proceed to the [facility] Radiology Department (first floor) to check-in and test prep.  If scheduled at [facility], please arrive 15 mins early for check-in and test prep.  Please follow these instructions carefully (unless otherwise directed):   On the Night Before the Test: Be sure to Drink plenty of water. Do not consume any caffeinated/decaffeinated beverages or chocolate 12 hours prior to your test. Do not take any antihistamines 12 hours prior to your test. If the patient has contrast allergy: Patient will need a prescription for Prednisone and very clear instructions (as follows): Prednisone 50 mg - take 13 hours prior to test Take another Prednisone 50 mg 7 hours prior to test Take another Prednisone 50 mg 1 hour prior to test Take Benadryl 50 mg 1 hour prior to test Patient must complete all four doses of above prophylactic  medications. Patient will need a ride after test due to Benadryl.  On the Day of the Test: Drink plenty of water until 1 hour prior to the test. Do not eat any food 4 hours prior to the test. You may take your regular medications prior to the test.  The day of the CT, Take 100 mg metoprolol succinate in the morning and 50 mg at night. HOLD Hydrochlorothiazide morning of the test. FEMALES- please wear underwire-free bra if available, avoid dresses & tight clothing       After the Test: Drink plenty of water. After receiving IV contrast, you may experience a mild flushed feeling. This is normal. On occasion, you may experience a mild rash up to 24 hours after the test. This is not dangerous. If this occurs, you can take Benadryl 25 mg and increase your fluid intake. If you experience trouble breathing, this can be serious. If it is severe call 911 IMMEDIATELY. If it is mild, please call our office. If you take any of these medications: Glipizide/Metformin, Avandament, Glucavance, please do not take 48 hours after completing test unless otherwise instructed.  We will call to schedule your test 2-4 weeks out understanding that some insurance companies will need an authorization prior to the service being performed.   For non-scheduling related questions, please contact the cardiac imaging nurse navigator should you have any questions/concerns: Marchia Bond, Cardiac Imaging Nurse Navigator Gordy Clement, Cardiac Imaging Nurse Navigator [facility] Direct Office Dial: [phone]   For scheduling needs, including cancellations and rescheduling, please call [place], [phone].    Signed, Buford Dresser, MD PhD 11/18/2021   [place]

## 2021-11-21 ENCOUNTER — Ambulatory Visit: Payer: BC Managed Care – PPO | Admitting: Neurology

## 2021-11-24 ENCOUNTER — Encounter (HOSPITAL_BASED_OUTPATIENT_CLINIC_OR_DEPARTMENT_OTHER): Payer: Self-pay | Admitting: Cardiology

## 2021-11-26 DIAGNOSIS — M79644 Pain in right finger(s): Secondary | ICD-10-CM | POA: Diagnosis not present

## 2021-11-26 DIAGNOSIS — R2230 Localized swelling, mass and lump, unspecified upper limb: Secondary | ICD-10-CM | POA: Diagnosis not present

## 2021-11-26 DIAGNOSIS — M13841 Other specified arthritis, right hand: Secondary | ICD-10-CM | POA: Diagnosis not present

## 2021-11-27 DIAGNOSIS — M9902 Segmental and somatic dysfunction of thoracic region: Secondary | ICD-10-CM | POA: Diagnosis not present

## 2021-11-27 DIAGNOSIS — M9903 Segmental and somatic dysfunction of lumbar region: Secondary | ICD-10-CM | POA: Diagnosis not present

## 2021-11-27 DIAGNOSIS — M9901 Segmental and somatic dysfunction of cervical region: Secondary | ICD-10-CM | POA: Diagnosis not present

## 2021-11-27 DIAGNOSIS — M9905 Segmental and somatic dysfunction of pelvic region: Secondary | ICD-10-CM | POA: Diagnosis not present

## 2021-11-28 ENCOUNTER — Telehealth (HOSPITAL_COMMUNITY): Payer: Self-pay | Admitting: *Deleted

## 2021-11-28 NOTE — Telephone Encounter (Signed)
Reaching out to patient to offer assistance regarding upcoming cardiac imaging study; pt verbalizes understanding of appt date/time, parking situation and where to check in, pre-test NPO status and medications ordered, and verified current allergies; name and call back number provided for further questions should they arise  Larey Brick RN Navigator Cardiac Imaging [facility] [phone] office [phone] cell  Patient verbalized understanding of how to take 13 hour prep and will take 100mg  metoprolol succinate for her test. She is aware to arrive at 2pm for her 2:30pm scan.

## 2021-12-01 ENCOUNTER — Other Ambulatory Visit: Payer: Self-pay

## 2021-12-01 ENCOUNTER — Encounter (HOSPITAL_COMMUNITY): Payer: Self-pay

## 2021-12-01 ENCOUNTER — Ambulatory Visit (HOSPITAL_COMMUNITY)
Admission: RE | Admit: 2021-12-01 | Discharge: 2021-12-01 | Disposition: A | Discharge status: Home / Self Care | Payer: BC Managed Care – PPO | Source: Ambulatory Visit | Attending: Cardiology | Admitting: Cardiology

## 2021-12-01 DIAGNOSIS — R072 Precordial pain: Secondary | ICD-10-CM

## 2021-12-01 IMAGING — CT CT HEART MORP W/ CTA COR W/ SCORE W/ CA W/CM &/OR W/O CM
4 of 7 series · 8 of 20 positions shown, 9 images · IV contrast (APPLIED)
Comparison: [DATE].

Addendum:
CLINICAL DATA: Chest pain

EXAM:
Cardiac CTA
MEDICATIONS:
Sub lingual nitro. 4mg and lopressor 100mg oral
13 hour Prep for contrast allergy
Lopressor 5 mg iv
Cardizem 20 mg iv
TECHNIQUE: The patient was scanned on a Siemens Force [REDACTED]ice scanner. Gantry
rotation speed was 250 msecs. Collimation was .6 mm. A 100 kV
prospective scan was triggered in the ascending thoracic aorta at
140 HU's Full mA was used between 35% and 75% of the R-R interval.
Average HR during the scan was bpm. The 3D data set was interpreted
on a dedicated work station using MPR, MIP and VRT modes. A total of
80 cc of contrast was used.

[Series 6: best syst · axial · 0.39mm/px · z∈[-323,-286]mm · 2 of 281 slices shown, 3 images]
[im 94/281  vessel]
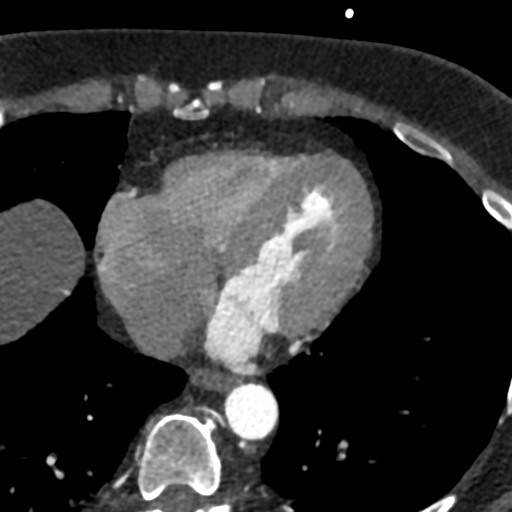
[im 94/281  lung]
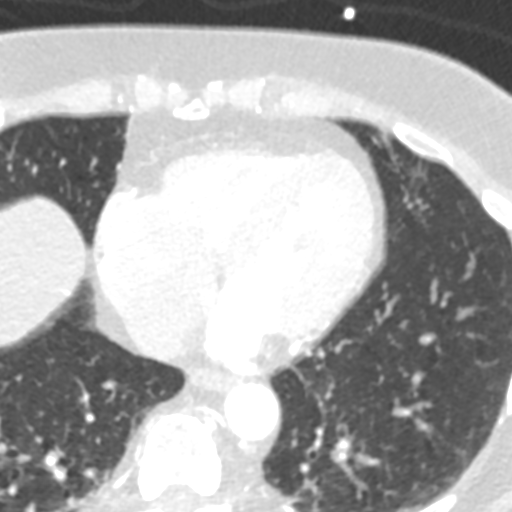
[im 187/281  vessel]
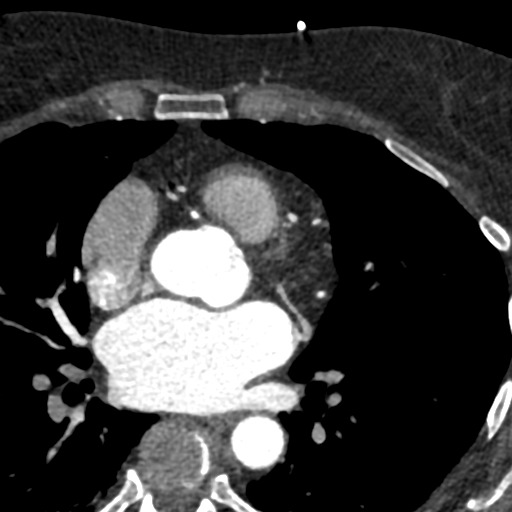

[Series 7: ts syst sharp · axial · 0.39mm/px · z∈[-323,-286]mm · 2 of 281 slices shown]
[im 94/281  lung]
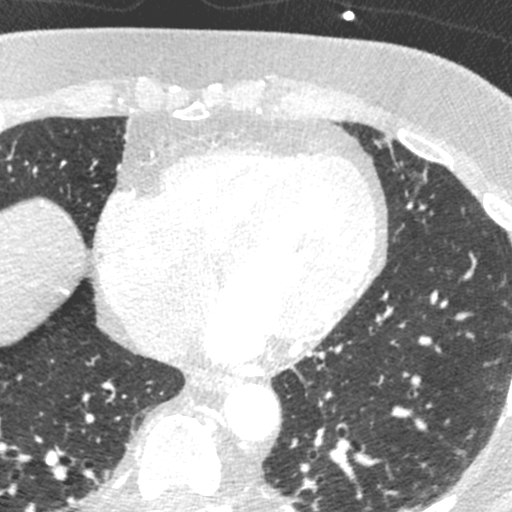
[im 187/281  lung]
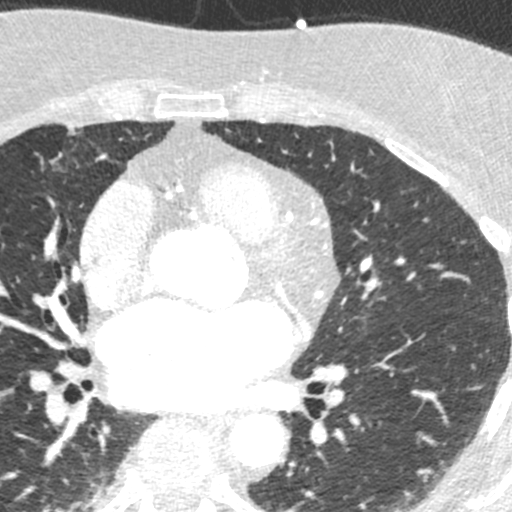

[Series 8: best diast · axial · 0.39mm/px · z∈[-323,-286]mm · 2 of 281 slices shown]
[im 94/281  vessel]
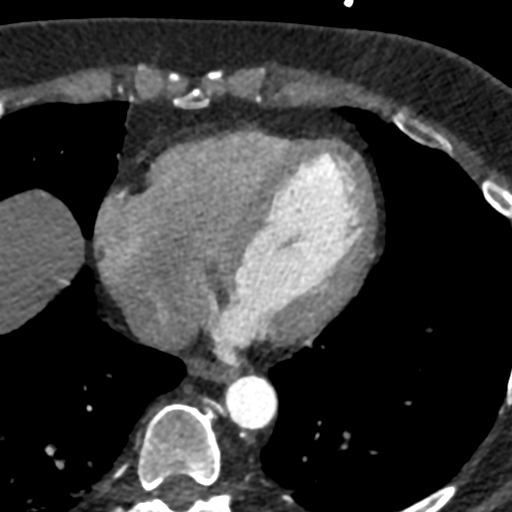
[im 187/281  vessel]
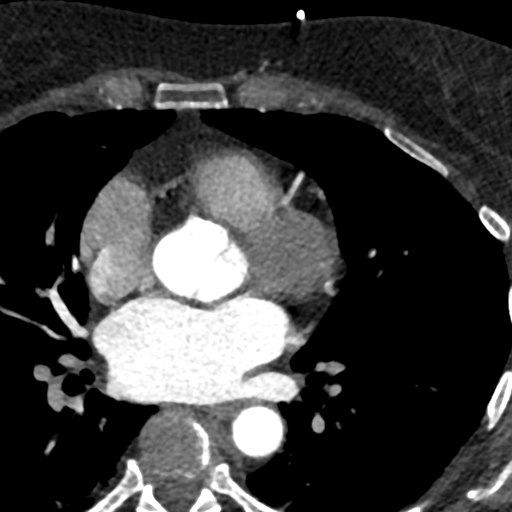

[Series 10: ts diast sharp · axial · 0.39mm/px · z∈[-323,-286]mm · 2 of 281 slices shown]
[im 94/281  lung]
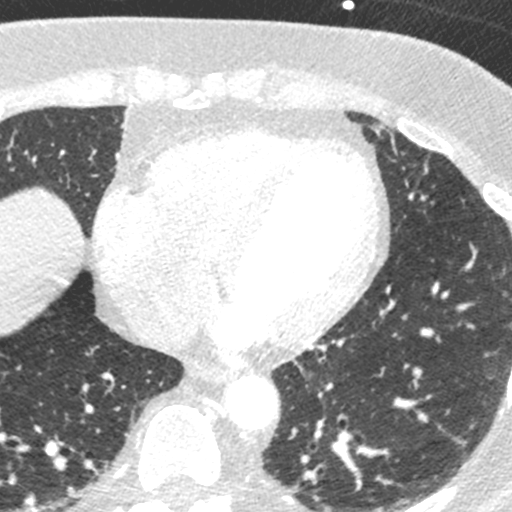
[im 187/281  lung]
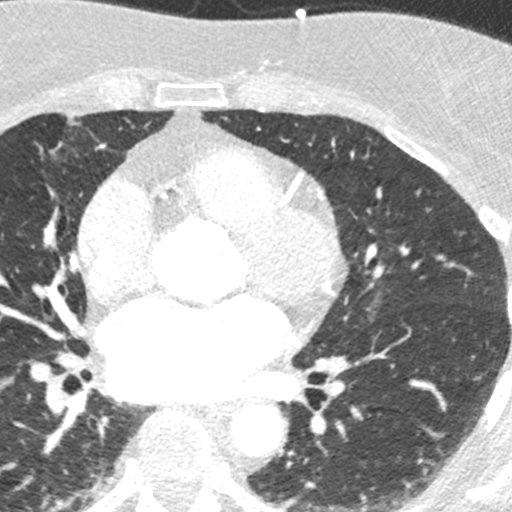

[8 of 20 positions shown; findings below may reference images not displayed]

FINDINGS: Non-cardiac: See separate report from [REDACTED]. No
significant findings on limited lung and soft tissue windows.

Calcium Score: No calcium noted

Coronary Arteries: Right dominant with no anomalies

LM: Normal

LAD: Normal

D1: Normal

D2: Normal

Circumflex: Normal

OM1: Normal

OM2: Normal

RCA: Normal

PDA: Normal

PLA: Normal
IMPRESSION: 1.  Calcium score 0

2.  Normal right dominant coronary arteries

3.  Normal ascending thoracic aorta 3.0 cm

CZARINA

EXAM:
OVER-READ INTERPRETATION  CT CHEST

The following report is an over-read performed by radiologist Dr.
over-read does not include interpretation of cardiac or coronary
anatomy or pathology. The interpretation by the cardiologist is
attached.
FINDINGS: No acute extracardiac findings.
IMPRESSION: No acute extracardiac findings.

*** End of Addendum ***
FINDINGS: Non-cardiac: See separate report from [REDACTED]. No
significant findings on limited lung and soft tissue windows.

Calcium Score: No calcium noted

Coronary Arteries: Right dominant with no anomalies

LM: Normal

LAD: Normal

D1: Normal

D2: Normal

Circumflex: Normal

OM1: Normal

OM2: Normal

RCA: Normal

PDA: Normal

PLA: Normal
IMPRESSION: 1.  Calcium score 0

2.  Normal right dominant coronary arteries

3.  Normal ascending thoracic aorta 3.0 cm

CZARINA

## 2021-12-01 MED ORDER — IOHEXOL 350 MG/ML SOLN
95.0000 mL | Freq: Once | INTRAVENOUS | Status: AC | PRN
  Administered 2021-12-01: 95 mL via INTRAVENOUS

## 2021-12-01 MED ORDER — METOPROLOL TARTRATE 5 MG/5ML IV SOLN
5.0000 mg | INTRAVENOUS | Status: DC | PRN
  Administered 2021-12-01: 5 mg via INTRAVENOUS

## 2021-12-01 MED ORDER — METOPROLOL TARTRATE 5 MG/5ML IV SOLN
INTRAVENOUS | Status: AC
  Filled 2021-12-01: qty 10

## 2021-12-01 MED ORDER — NITROGLYCERIN 0.4 MG SL SUBL: 0.8000 mg | SUBLINGUAL_TABLET | Freq: Once | SUBLINGUAL | Status: AC

## 2021-12-01 MED ORDER — DILTIAZEM HCL 25 MG/5ML IV SOLN: 10.0000 mg | Freq: Once | INTRAVENOUS | Status: AC

## 2021-12-01 MED ORDER — NITROGLYCERIN 0.4 MG SL SUBL
SUBLINGUAL_TABLET | SUBLINGUAL | Status: AC
  Administered 2021-12-01: 0.8 mg via SUBLINGUAL
  Filled 2021-12-01: qty 2

## 2021-12-01 MED ORDER — DILTIAZEM HCL 25 MG/5ML IV SOLN
INTRAVENOUS | Status: AC
  Administered 2021-12-01: 10 mg via INTRAVENOUS
  Filled 2021-12-01: qty 5

## 2021-12-01 MED ORDER — DILTIAZEM HCL 25 MG/5ML IV SOLN
10.0000 mg | Freq: Once | INTRAVENOUS | Status: AC
  Administered 2021-12-01: 10 mg via INTRAVENOUS

## 2021-12-02 ENCOUNTER — Other Ambulatory Visit: Payer: Self-pay | Admitting: Neurology

## 2021-12-02 ENCOUNTER — Telehealth: Payer: Self-pay

## 2021-12-02 NOTE — Telephone Encounter (Signed)
New message   Lynzey Stallcup (Key: [id number]) Aimovig 140MG /ML auto-injectors   Form [employer] Electronic [place] Form [phone] NCPDP) Created 3 minutes ago Sent to Plan less than a minute ago Plan Response less than a minute ago Submit Clinical Questions Determination Message from Plan Patient not eligible ('does not have current coverage with the PBM/payer. Reimbursement requests - submit via other methods.' )

## 2021-12-03 DIAGNOSIS — M9903 Segmental and somatic dysfunction of lumbar region: Secondary | ICD-10-CM | POA: Diagnosis not present

## 2021-12-03 DIAGNOSIS — M9902 Segmental and somatic dysfunction of thoracic region: Secondary | ICD-10-CM | POA: Diagnosis not present

## 2021-12-03 DIAGNOSIS — M7551 Bursitis of right shoulder: Secondary | ICD-10-CM | POA: Diagnosis not present

## 2021-12-03 DIAGNOSIS — M9905 Segmental and somatic dysfunction of pelvic region: Secondary | ICD-10-CM | POA: Diagnosis not present

## 2021-12-03 DIAGNOSIS — M9901 Segmental and somatic dysfunction of cervical region: Secondary | ICD-10-CM | POA: Diagnosis not present

## 2021-12-04 NOTE — Telephone Encounter (Signed)
F/u  [name] (Key: [id number]) Aimovig 140MG /ML auto-injectors   Form [employer] Plan [place] Electronic PA Form [phone] NCPDP) Created 2 days ago Sent to Plan 2 days ago Plan Response 2 days ago Submit Clinical Questions Determination Message from Plan Patient not eligible ('does not have current coverage with the PBM/payer. Reimbursement requests - submit via other methods.' )

## 2021-12-08 DIAGNOSIS — M9905 Segmental and somatic dysfunction of pelvic region: Secondary | ICD-10-CM | POA: Diagnosis not present

## 2021-12-08 DIAGNOSIS — M7551 Bursitis of right shoulder: Secondary | ICD-10-CM | POA: Diagnosis not present

## 2021-12-08 DIAGNOSIS — M9901 Segmental and somatic dysfunction of cervical region: Secondary | ICD-10-CM | POA: Diagnosis not present

## 2021-12-08 DIAGNOSIS — M9903 Segmental and somatic dysfunction of lumbar region: Secondary | ICD-10-CM | POA: Diagnosis not present

## 2021-12-08 DIAGNOSIS — M9902 Segmental and somatic dysfunction of thoracic region: Secondary | ICD-10-CM | POA: Diagnosis not present

## 2021-12-17 DIAGNOSIS — M9903 Segmental and somatic dysfunction of lumbar region: Secondary | ICD-10-CM | POA: Diagnosis not present

## 2021-12-17 DIAGNOSIS — M9902 Segmental and somatic dysfunction of thoracic region: Secondary | ICD-10-CM | POA: Diagnosis not present

## 2021-12-17 DIAGNOSIS — M9905 Segmental and somatic dysfunction of pelvic region: Secondary | ICD-10-CM | POA: Diagnosis not present

## 2021-12-17 DIAGNOSIS — M7551 Bursitis of right shoulder: Secondary | ICD-10-CM | POA: Diagnosis not present

## 2021-12-17 DIAGNOSIS — M9901 Segmental and somatic dysfunction of cervical region: Secondary | ICD-10-CM | POA: Diagnosis not present

## 2021-12-24 DIAGNOSIS — M9903 Segmental and somatic dysfunction of lumbar region: Secondary | ICD-10-CM | POA: Diagnosis not present

## 2021-12-24 DIAGNOSIS — M9902 Segmental and somatic dysfunction of thoracic region: Secondary | ICD-10-CM | POA: Diagnosis not present

## 2021-12-24 DIAGNOSIS — M9905 Segmental and somatic dysfunction of pelvic region: Secondary | ICD-10-CM | POA: Diagnosis not present

## 2021-12-24 DIAGNOSIS — M7551 Bursitis of right shoulder: Secondary | ICD-10-CM | POA: Diagnosis not present

## 2021-12-24 DIAGNOSIS — M9901 Segmental and somatic dysfunction of cervical region: Secondary | ICD-10-CM | POA: Diagnosis not present

## 2021-12-31 DIAGNOSIS — M9902 Segmental and somatic dysfunction of thoracic region: Secondary | ICD-10-CM | POA: Diagnosis not present

## 2021-12-31 DIAGNOSIS — M9901 Segmental and somatic dysfunction of cervical region: Secondary | ICD-10-CM | POA: Diagnosis not present

## 2021-12-31 DIAGNOSIS — M9905 Segmental and somatic dysfunction of pelvic region: Secondary | ICD-10-CM | POA: Diagnosis not present

## 2021-12-31 DIAGNOSIS — M9903 Segmental and somatic dysfunction of lumbar region: Secondary | ICD-10-CM | POA: Diagnosis not present

## 2021-12-31 DIAGNOSIS — M7552 Bursitis of left shoulder: Secondary | ICD-10-CM | POA: Diagnosis not present

## 2022-01-08 DIAGNOSIS — I10 Essential (primary) hypertension: Secondary | ICD-10-CM | POA: Diagnosis not present

## 2022-01-09 NOTE — Progress Notes (Signed)
? ?NEUROLOGY FOLLOW UP OFFICE NOTE ? ?Stephanie Hall ?[id number] ? ?Assessment/Plan:  ? 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  Unclear ir residual symptoms from previous vestibular neuritis or migrainous. ?  ?Refer to Sports Medicine regarding neck pain ?Headache rescue:  rizatriptan, methocarbamol, acetaminophen ?Limit use of pain relievers to no more than 2 days out of week to prevent risk of rebound or medication-overuse headache. ?Keep headache diary ?Follow up 1 year ?  ?  ?Subjective:  ?Stephanie Hall is a 61 year old right-handed white female with hypertension who follows up for vertigo and migraine ?UPDATE: ?Current medications:  rizatriptan 10mg , methocarbamol, acetaminophen, duloxetine 30mg  daily   ?  ?She stopped Aimovig because it was ineffective.  She had 4 moderate headaches in March, 3 she woke up with them.  Breaks in a couple of  hours in with Robaxin, rizatriptan and Tylenol.  Ocular migraines occur once a week, lasting 15 minutes.   ?  ?HISTORY: ?She works as an Therapist, sports at [employer].  On 10/12/2019, she was at work walking down the hall when she suddenly felt dizzy.  She noted spinning vertigo and headache.  She went home but the vertigo became worse and she had nausea and vomiting.  She returned to the ED for evaluation.  She was given meclizine and Zofran which were ineffective.  Initial head CT was normal.  She was transported to [facility] for an MRI.  MRI brain with and without contrast were normal. Labs and EKG revealed no cardiac abnormality, anemia or electrolyte imbalance.  She was diagnosed with peripheral vertigo.  She felt wobbly for the next 3 days.  Then on 10/21/2019, she woke up with severe pounding and squeezing bilateral retro-orbital headache as well as nausea and feeling unsteady but no vertigo.  Symptoms  subsided.  She had a recurrence on 10/25/2019 and symptoms have persisted.  She reports that her eyes feel like they are floating in syrup.  She reports some blurred vision, particularly with distance and her reading glasses don't work but she denies double vision and visual obscurations.  She has tinnitus but denies pulsatile tinnitus.  She reports right sided aural fullness.  Dizziness is aggravated by riding in a car.  She feels unsteady on her feet.  She finds it a little difficult to speak, like her tongue is swollen, as well as some word-finding difficulty.  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  She was placed on acetazolamide 500mg  twice daily and was started on a course of prednisone and has Valium on-hand.  I spoke with patient's ophthalmologist, Dr. Baird Cancer, who in retrospect wasn't convinced that she definitely had papilledema on exam.  I still wanted to proceed with workup with MRA/MRV of head and lumbar puncture.  However, thinking that her vertigo may be migraine, I wanted her to switch from acetazolamide to topiramate 25mg  twice daily.  MRA of head on 11/20/2019 showed possible moderate proximal left M2 stenosis but. follow up CTA of head on 11/30/2019 confirmed it was artifact.  MRV of head on 11/23/2019 showed no stenosis or thrombosis.  Lumbar punctre on 11/24/2019 demonstrated normal opening pressure of 16 cm water. ?  ?She reports history of headaches all of her life, which sound like migraines.  They are normally severe pressure-like pain in the frontal region or posterior neck and  occipital regions.  They are associated with nausea and photophobia.  She also has history of ocular migraines presenting as scintillating scotoma lasting 20 minutes. ? ?Every couple of months, she gets a left facial neuralgia - sharp twinge in the temple and a mild burning discomfort in the V1-V2 distribution.  May last a day.  It comes and goes  over the past 10 years.  Saw neurology at that time.  Sed rate was unremarkable.  More frequent since the vestibular episode.   ?  ?Past antiepileptic:  topiramate (cognitive deficits) ?Past CGRP inhibitor:  Aimovig 140mg  ? ?PAST MEDICAL HISTORY: ?Past Medical History:  ?Diagnosis Date  ? Hypertension   ? ? ?MEDICATIONS: ?Current Outpatient Medications on File Prior to Visit  ?Medication Sig Dispense Refill  ? AIMOVIG 140 MG/ML SOAJ INJECT 140 MG INTO THE SKIN EVERY 28 (TWENTY-EIGHT) DAYS. 3 mL 0  ? ALPRAZolam (XANAX) 0.5 MG tablet Take 0.5 mg by mouth as needed.    ? b complex vitamins tablet Take 1 tablet by mouth daily.    ? Diclofenac-miSOPROStol 75-0.2 MG TBEC diclofenac 75 mg-misoprostol 200 mcg tablet,immediate,delayed release    ? diphenhydrAMINE (BENADRYL) 50 MG tablet Take 1 tablet (50 mg total) by mouth once for 1 dose. 1 hour prior to test 1 tablet 0  ? DULoxetine (CYMBALTA) 30 MG capsule Take 30 mg by mouth daily.    ? hydrochlorothiazide (HYDRODIURIL) 25 MG tablet Take 25 mg by mouth daily.    ? linaclotide (LINZESS) 145 MCG CAPS capsule Take by mouth.    ? methocarbamol (ROBAXIN) 500 MG tablet As needed.    ? metoprolol succinate (TOPROL-XL) 50 MG 24 hr tablet Take 1 tablet by mouth 2 (two) times daily.    ? Mirabegron (MYRBETRIQ PO) Take 1 tablet by mouth daily at 12 noon.    ? omeprazole (PRILOSEC) 40 MG capsule Take 1 capsule by mouth daily.    ? OVER THE COUNTER MEDICATION Multivitamin-Take 1 table by mouth daily.    ? predniSONE (DELTASONE) 50 MG tablet Take 1 tablet by mouth 13 hrs prior to test, another tablet 7 hours prior, and last dose 1 hour prior. 3 tablet 0  ? rizatriptan (MAXALT-MLT) 10 MG disintegrating tablet Take 1 tablet earliest onset of migraine.  May repeat in 2 hours if needed. Maximum 2 tablets in 24 hours. 9 tablet 5  ? traZODone (DESYREL) 50 MG tablet trazodone 50 mg tablet    ? ?No current facility-administered medications on file prior to visit.  ? ? ?ALLERGIES: ?Allergies   ?Allergen Reactions  ? Adhesive  [Tape] Rash  ? Contrast Media  [Iodinated Contrast Media] Dermatitis, Itching and Rash  ? Neosporin [Bacitracin-Polymyxin B] Hives and Rash  ? ? ?FAMILY HISTORY: ?No family history on file. ? ?  ?Objective:  ?Blood pressure 139/70, pulse 83, height 5\' 4"  (1.626 m), weight 182 lb (82.6 kg), SpO2 94 %. ?General: No acute distress.  Patient appears well-groomed.   ?Head:  Normocephalic/atraumatic ?Eyes:  Fundi examined but not visualized ?Neck: supple, no paraspinal tenderness, full range of motion ?Heart:  Regular rate and rhythm ?Lungs:  Clear to auscultation bilaterally ?Back: No paraspinal tenderness ?Neurological Exam: alert and oriented to person, place, and time.  Speech fluent and not dysarthric, language intact.  CN II-XII intact. Bulk and tone normal, muscle strength 5/5 throughout.  Sensation to light touch intact.  Deep tendon reflexes 2+ throughout, toes downgoing.  Finger to nose testing intact.  Gait normal, Romberg negative. ? ? ?  Metta Clines, DO ? ?CC: Reynold Bowen, MD ? ? ? ? ? ? ?

## 2022-01-12 ENCOUNTER — Ambulatory Visit (INDEPENDENT_AMBULATORY_CARE_PROVIDER_SITE_OTHER): Payer: BC Managed Care – PPO | Admitting: Neurology

## 2022-01-12 ENCOUNTER — Encounter: Payer: Self-pay | Admitting: Neurology

## 2022-01-12 VITALS — BP 139/70 | HR 83 | Ht 64.0 in | Wt 182.0 lb

## 2022-01-12 DIAGNOSIS — G43109 Migraine with aura, not intractable, without status migrainosus: Secondary | ICD-10-CM | POA: Diagnosis not present

## 2022-01-12 DIAGNOSIS — M542 Cervicalgia: Secondary | ICD-10-CM | POA: Diagnosis not present

## 2022-01-12 DIAGNOSIS — G43009 Migraine without aura, not intractable, without status migrainosus: Secondary | ICD-10-CM

## 2022-01-12 DIAGNOSIS — G501 Atypical facial pain: Secondary | ICD-10-CM | POA: Diagnosis not present

## 2022-01-12 NOTE — Patient Instructions (Addendum)
When you get a headache, take the rizatriptan, methocarbamol and acetaminophen as needed. ?Limit use of pain relievers to no more than 2 days out of week to prevent risk of rebound or medication-overuse headache. ?Keep headache diary ?Refer to Dr. Hulan Saas of [place] for neck pain ?Follow up one year ?

## 2022-01-14 DIAGNOSIS — M79672 Pain in left foot: Secondary | ICD-10-CM | POA: Diagnosis not present

## 2022-01-15 ENCOUNTER — Other Ambulatory Visit: Payer: Self-pay

## 2022-01-15 MED ORDER — RIZATRIPTAN BENZOATE 10 MG PO TBDP: ORAL_TABLET | ORAL | 5 refills | Status: DC

## 2022-01-15 NOTE — Telephone Encounter (Signed)
Refill request recived from [facility] for Rizatriptan ? ?Refill sent ?

## 2022-02-19 DIAGNOSIS — M2012 Hallux valgus (acquired), left foot: Secondary | ICD-10-CM | POA: Diagnosis not present

## 2022-02-19 DIAGNOSIS — G8918 Other acute postprocedural pain: Secondary | ICD-10-CM | POA: Diagnosis not present

## 2022-02-19 DIAGNOSIS — M7742 Metatarsalgia, left foot: Secondary | ICD-10-CM | POA: Diagnosis not present

## 2022-02-19 DIAGNOSIS — Y999 Unspecified external cause status: Secondary | ICD-10-CM | POA: Diagnosis not present

## 2022-02-19 DIAGNOSIS — T8484XA Pain due to internal orthopedic prosthetic devices, implants and grafts, initial encounter: Secondary | ICD-10-CM | POA: Diagnosis not present

## 2022-02-19 DIAGNOSIS — M2042 Other hammer toe(s) (acquired), left foot: Secondary | ICD-10-CM | POA: Diagnosis not present

## 2022-02-25 ENCOUNTER — Ambulatory Visit: Payer: BC Managed Care – PPO | Admitting: Family Medicine

## 2022-02-25 DIAGNOSIS — M79672 Pain in left foot: Secondary | ICD-10-CM | POA: Diagnosis not present

## 2022-03-16 DIAGNOSIS — L308 Other specified dermatitis: Secondary | ICD-10-CM | POA: Diagnosis not present

## 2022-03-19 DIAGNOSIS — G43809 Other migraine, not intractable, without status migrainosus: Secondary | ICD-10-CM | POA: Diagnosis not present

## 2022-03-19 DIAGNOSIS — G43119 Migraine with aura, intractable, without status migrainosus: Secondary | ICD-10-CM | POA: Diagnosis not present

## 2022-03-19 DIAGNOSIS — H60313 Diffuse otitis externa, bilateral: Secondary | ICD-10-CM | POA: Diagnosis not present

## 2022-03-19 DIAGNOSIS — Z974 Presence of external hearing-aid: Secondary | ICD-10-CM | POA: Diagnosis not present

## 2022-03-19 DIAGNOSIS — H903 Sensorineural hearing loss, bilateral: Secondary | ICD-10-CM | POA: Diagnosis not present

## 2022-03-19 DIAGNOSIS — R42 Dizziness and giddiness: Secondary | ICD-10-CM | POA: Diagnosis not present

## 2022-03-19 DIAGNOSIS — H9313 Tinnitus, bilateral: Secondary | ICD-10-CM | POA: Diagnosis not present

## 2022-03-26 ENCOUNTER — Ambulatory Visit: Payer: BC Managed Care – PPO | Admitting: Family Medicine

## 2022-04-01 DIAGNOSIS — M79672 Pain in left foot: Secondary | ICD-10-CM | POA: Diagnosis not present

## 2022-04-10 DIAGNOSIS — M9905 Segmental and somatic dysfunction of pelvic region: Secondary | ICD-10-CM | POA: Diagnosis not present

## 2022-04-10 DIAGNOSIS — M7552 Bursitis of left shoulder: Secondary | ICD-10-CM | POA: Diagnosis not present

## 2022-04-10 DIAGNOSIS — M9902 Segmental and somatic dysfunction of thoracic region: Secondary | ICD-10-CM | POA: Diagnosis not present

## 2022-04-10 DIAGNOSIS — M9903 Segmental and somatic dysfunction of lumbar region: Secondary | ICD-10-CM | POA: Diagnosis not present

## 2022-04-10 DIAGNOSIS — M9901 Segmental and somatic dysfunction of cervical region: Secondary | ICD-10-CM | POA: Diagnosis not present

## 2022-04-22 DIAGNOSIS — M9901 Segmental and somatic dysfunction of cervical region: Secondary | ICD-10-CM | POA: Diagnosis not present

## 2022-04-22 DIAGNOSIS — M9903 Segmental and somatic dysfunction of lumbar region: Secondary | ICD-10-CM | POA: Diagnosis not present

## 2022-04-22 DIAGNOSIS — M9905 Segmental and somatic dysfunction of pelvic region: Secondary | ICD-10-CM | POA: Diagnosis not present

## 2022-04-22 DIAGNOSIS — M9902 Segmental and somatic dysfunction of thoracic region: Secondary | ICD-10-CM | POA: Diagnosis not present

## 2022-04-22 DIAGNOSIS — M9906 Segmental and somatic dysfunction of lower extremity: Secondary | ICD-10-CM | POA: Diagnosis not present

## 2022-04-24 NOTE — Progress Notes (Unsigned)
[name] [address] [place] [address] Phone: [phone] Subjective:   Bruce Donath, am serving as a scribe for Dr. Antoine Primas.  172lb I'm seeing this patient by the request  of:  Adrian Prince, MD  CC: neck pain follow up   [id number]  Stephanie Hall is a 61 y.o. female coming in with complaint of cervicalgia. Sees Dr. Everlena Cooper. Patient has constant pain in neck and but also has pain throughout thoracic spine. Does massage and chiro care. Has headaches frequently. Does have tingling bilaterally into the 4th and 5th fingers. Has tried gabapentin.   Acute thoracic spine pain since Friday.        Past Medical History:  Diagnosis Date   Hypertension    Past Surgical History:  Procedure Laterality Date   ROTATOR CUFF REPAIR     Social History   Socioeconomic History   Marital status: Married    Spouse name: Not on file   Number of children: Not on file   Years of education: Not on file   Highest education level: Not on file  Occupational History   Not on file  Tobacco Use   Smoking status: Never   Smokeless tobacco: Never  Substance and Sexual Activity   Alcohol use: Not Currently   Drug use: Not Currently   Sexual activity: Not on file  Other Topics Concern   Not on file  Social History Narrative   Right handed   Social Determinants of Health   Financial Resource Strain: Not on file  Food Insecurity: Not on file  Transportation Needs: Not on file  Physical Activity: Not on file  Stress: Not on file  Social Connections: Not on file   Allergies  Allergen Reactions   Adhesive  [Tape] Rash   Contrast Media  [Iodinated Contrast Media] Dermatitis, Itching and Rash   Neosporin [Bacitracin-Polymyxin B] Hives and Rash   No family history on file.   Current Outpatient Medications (Cardiovascular):    hydrochlorothiazide (HYDRODIURIL) 25 MG tablet, Take 25 mg by mouth daily.   metoprolol succinate  (TOPROL-XL) 50 MG 24 hr tablet, Take 1 tablet by mouth 2 (two) times daily.   Current Outpatient Medications (Analgesics):    rizatriptan (MAXALT-MLT) 10 MG disintegrating tablet, Take 1 tablet earliest onset of migraine.  May repeat in 2 hours if needed. Maximum 2 tablets in 24 hours.   Current Outpatient Medications (Other):    ALPRAZolam (XANAX) 0.5 MG tablet, Take 0.5 mg by mouth as needed.   DULoxetine (CYMBALTA) 30 MG capsule, Take 30 mg by mouth daily.   linaclotide (LINZESS) 145 MCG CAPS capsule, Take by mouth.   methocarbamol (ROBAXIN) 500 MG tablet, As needed.   OVER THE COUNTER MEDICATION, Multivitamin-Take 1 table by mouth daily.   traZODone (DESYREL) 50 MG tablet, trazodone 50 mg tablet   omeprazole (PRILOSEC) 40 MG capsule, Take 1 capsule by mouth daily.   Reviewed prior external information including notes and imaging from  primary care provider As well as notes that were available from care everywhere and other healthcare systems.  Reviewed patient's imaging including MRI of the brain that was done in 2021 as well as the MR angiogram and CT of the head without any significant findings.  Past medical history, social, surgical and family history all reviewed in electronic medical record.  No pertanent information unless stated regarding to the chief complaint.   Review of Systems:  No , visual changes, nausea, vomiting, diarrhea, constipation,  dizziness, abdominal pain, skin rash, fevers, chills, night sweats, weight loss, swollen lymph nodes, body aches, joint swelling, chest pain, shortness of breath, mood changes. POSITIVE muscle aches, headache  Objective  Blood pressure 110/78, pulse 73, height 5\' 4"  (1.626 m), weight 172 lb (78 kg).   General: No apparent distress alert and oriented x3 mood and affect normal, dressed appropriately.  HEENT: Pupils equal, extraocular movements intact  Respiratory: Patient's speak in full sentences and does not appear short of breath   Cardiovascular: No lower extremity edema, non tender, no erythema  Neck exam does have some loss of lordosis.  Some tightness noted with sidebending bilaterally.  Mild crepitus felt.  Patient has some loss of more flexion and extension of the back though.  Tightness noted in the parascapular region as well.  Osteopathic findings  C2 flexed rotated and side bent right C4 flexed rotated and side bent left C6 flexed rotated and side bent left T3 extended rotated and side bent right inhaled third rib T6 extended rotated and side bent left L2 flexed rotated and side bent right Sacrum right on right  [id number]; 15 additional minutes spent for Therapeutic exercises as stated in above notes.  This included exercises focusing on stretching, strengthening, with significant focus on eccentric aspects.   Long term goals include an improvement in range of motion, strength, endurance as well as avoiding reinjury. Patient's frequency would include in 1-2 times a day, 3-5 times a week for a duration of 6-12 weeks. Exercises that included:  Basic scapular stabilization to include adduction and depression of scapula Scaption, focusing on proper movement and good control Internal and External rotation utilizing a theraband, with elbow tucked at side entire time Rows with theraband    Proper technique shown and discussed handout in great detail with ATC.  All questions were discussed and answered.     Impression and Recommendations:    The above documentation has been reviewed and is accurate and complete , DO

## 2022-04-27 ENCOUNTER — Ambulatory Visit (INDEPENDENT_AMBULATORY_CARE_PROVIDER_SITE_OTHER): Payer: BC Managed Care – PPO | Admitting: Family Medicine

## 2022-04-27 ENCOUNTER — Ambulatory Visit (INDEPENDENT_AMBULATORY_CARE_PROVIDER_SITE_OTHER): Payer: BC Managed Care – PPO

## 2022-04-27 VITALS — BP 110/78 | HR 73 | Ht 64.0 in | Wt 172.0 lb

## 2022-04-27 DIAGNOSIS — M9902 Segmental and somatic dysfunction of thoracic region: Secondary | ICD-10-CM

## 2022-04-27 DIAGNOSIS — M9903 Segmental and somatic dysfunction of lumbar region: Secondary | ICD-10-CM | POA: Diagnosis not present

## 2022-04-27 DIAGNOSIS — M542 Cervicalgia: Secondary | ICD-10-CM

## 2022-04-27 DIAGNOSIS — M9908 Segmental and somatic dysfunction of rib cage: Secondary | ICD-10-CM

## 2022-04-27 DIAGNOSIS — M9901 Segmental and somatic dysfunction of cervical region: Secondary | ICD-10-CM

## 2022-04-27 DIAGNOSIS — M9904 Segmental and somatic dysfunction of sacral region: Secondary | ICD-10-CM | POA: Diagnosis not present

## 2022-04-27 DIAGNOSIS — M47812 Spondylosis without myelopathy or radiculopathy, cervical region: Secondary | ICD-10-CM

## 2022-04-27 NOTE — Patient Instructions (Signed)
  Good to see you.  Ice 20 minutes 2 times daily. Usually after activity and before bed. Exercises 3 times a week. Gabapentin 200 mg at night  Turmeric 500mg  daily  Tart cherry extract 1200mg  at night Vitamin D 2000 IU daily  See me again in 4-6 weeks

## 2022-04-27 NOTE — Assessment & Plan Note (Addendum)
Known degenerative disc disease no radicular symptoms we will get x-rays to further evaluate the amount of arthritic changes.  Patient did respond well to osteopathic manipulation.  Has the muscle relaxers for breakthrough.  Continue on the Cymbalta but can potentially consider increasing at follow-up if needed.  Home exercises given for more scapular strengthening.  Follow-up again in 6 to 8 weeks otherwise.

## 2022-04-29 DIAGNOSIS — M9903 Segmental and somatic dysfunction of lumbar region: Secondary | ICD-10-CM | POA: Diagnosis not present

## 2022-04-29 DIAGNOSIS — M9901 Segmental and somatic dysfunction of cervical region: Secondary | ICD-10-CM | POA: Diagnosis not present

## 2022-04-29 DIAGNOSIS — M9905 Segmental and somatic dysfunction of pelvic region: Secondary | ICD-10-CM | POA: Diagnosis not present

## 2022-04-29 DIAGNOSIS — M9906 Segmental and somatic dysfunction of lower extremity: Secondary | ICD-10-CM | POA: Diagnosis not present

## 2022-04-29 DIAGNOSIS — M9902 Segmental and somatic dysfunction of thoracic region: Secondary | ICD-10-CM | POA: Diagnosis not present

## 2022-05-06 DIAGNOSIS — M79672 Pain in left foot: Secondary | ICD-10-CM | POA: Diagnosis not present

## 2022-05-22 NOTE — Progress Notes (Unsigned)
  [name] [address] [place] [address] Phone: [phone] Subjective:   Bruce Donath, am serving as a scribe for Dr. Antoine Primas.  I'm seeing this patient by the request  of:  Adrian Prince, MD  CC: back and neck pain   [id number]  Stephanie Hall is a 61 y.o. female coming in with complaint of back and neck pain. OMT on 04/27/2022. Patient states that her neck is better with the stretches. Has been having L  scapula musculature spasms. Uses massage, heat and stretches.   Medications patient has been prescribed: None  Taking:         Reviewed prior external information including notes and imaging from previsou exam, outside providers and external EMR if available.   As well as notes that were available from care everywhere and other healthcare systems.  Past medical history, social, surgical and family history all reviewed in electronic medical record.  No pertanent information unless stated regarding to the chief complaint.   Past Medical History:  Diagnosis Date   Hypertension     Allergies  Allergen Reactions   Adhesive  [Tape] Rash   Contrast Media  [Iodinated Contrast Media] Dermatitis, Itching and Rash   Neosporin [Bacitracin-Polymyxin B] Hives and Rash     Review of Systems:  No headache, visual changes, nausea, vomiting, diarrhea, constipation, dizziness, abdominal pain, skin rash, fevers, chills, night sweats, weight loss, swollen lymph nodes,  joint swelling, chest pain, shortness of breath, mood changes. POSITIVE muscle aches, body aches   Objective  Blood pressure 112/76, pulse 78, height 5\' 4"  (1.626 m), weight 176 lb (79.8 kg), SpO2 97 %.   General: No apparent distress alert and oriented x3 mood and affect normal, dressed appropriately.  HEENT: Pupils equal, extraocular movements intact  Respiratory: Patient's speak in full sentences and does not appear short of breath  Cardiovascular: No lower  extremity edema, non tender, no erythema  Gait normal  MSK:  Back exam does have loss of lordosis severe pain, unable to use the left arm significantly with any pressure or lay down flat on the scapula.  Pain in the TL on left knee pain is out of proportion.  Patient does have tightness with straight leg test as well.      Assessment and Plan:           The above documentation has been reviewed and is accurate and complete , DO         Note: This dictation was prepared with Dragon dictation along with smaller phrase technology. Any transcriptional errors that result from this process are unintentional.

## 2022-05-25 ENCOUNTER — Ambulatory Visit (INDEPENDENT_AMBULATORY_CARE_PROVIDER_SITE_OTHER): Payer: BC Managed Care – PPO | Admitting: Family Medicine

## 2022-05-25 ENCOUNTER — Ambulatory Visit (INDEPENDENT_AMBULATORY_CARE_PROVIDER_SITE_OTHER): Payer: BC Managed Care – PPO

## 2022-05-25 VITALS — BP 112/76 | HR 78 | Ht 64.0 in | Wt 176.0 lb

## 2022-05-25 DIAGNOSIS — M5136 Other intervertebral disc degeneration, lumbar region: Secondary | ICD-10-CM

## 2022-05-25 DIAGNOSIS — M546 Pain in thoracic spine: Secondary | ICD-10-CM

## 2022-05-25 DIAGNOSIS — M545 Low back pain, unspecified: Secondary | ICD-10-CM

## 2022-05-25 DIAGNOSIS — M2550 Pain in unspecified joint: Secondary | ICD-10-CM

## 2022-05-25 LAB — COMPREHENSIVE METABOLIC PANEL
ALT: 19 U/L (ref 0–35)
AST: 24 U/L (ref 0–37)
Albumin: 4.4 g/dL (ref 3.5–5.2)
Alkaline Phosphatase: 100 U/L (ref 39–117)
BUN: 15 mg/dL (ref 6–23)
CO2: 31 mEq/L (ref 19–32)
Calcium: 9.6 mg/dL (ref 8.4–10.5)
Chloride: 99 mEq/L (ref 96–112)
Creatinine, Ser: 0.72 mg/dL (ref 0.40–1.20)
GFR: 88.88 mL/min (ref >60.00)
Glucose, Bld: 97 mg/dL (ref 70–99)
Potassium: 4.1 mEq/L (ref 3.5–5.1)
Sodium: 139 mEq/L (ref 135–145)
Total Bilirubin: 0.3 mg/dL (ref 0.2–1.2)
Total Protein: 7.2 g/dL (ref 6.0–8.3)

## 2022-05-25 LAB — CBC WITH DIFFERENTIAL/PLATELET
Basophils Absolute: 0 10*3/uL (ref 0.0–0.1)
Basophils Relative: 0.5 % (ref 0.0–3.0)
Eosinophils Absolute: 0.2 10*3/uL (ref 0.0–0.7)
Eosinophils Relative: 1.9 % (ref 0.0–5.0)
HCT: 40.3 % (ref 36.0–46.0)
Hemoglobin: 13.5 g/dL (ref 12.0–15.0)
Lymphocytes Relative: 34.7 % (ref 12.0–46.0)
Lymphs Abs: 2.9 10*3/uL (ref 0.7–4.0)
MCHC: 33.5 g/dL (ref 30.0–36.0)
MCV: 95.1 fl (ref 78.0–100.0)
Monocytes Absolute: 0.6 10*3/uL (ref 0.1–1.0)
Monocytes Relative: 7.1 % (ref 3.0–12.0)
Neutro Abs: 4.7 10*3/uL (ref 1.4–7.7)
Neutrophils Relative %: 55.8 % (ref 43.0–77.0)
Platelets: 290 10*3/uL (ref 150.0–400.0)
RBC: 4.24 Mil/uL (ref 3.87–5.11)
RDW: 13.5 % (ref 11.5–15.5)
WBC: 8.4 10*3/uL (ref 4.0–10.5)

## 2022-05-25 LAB — URIC ACID: Uric Acid, Serum: 5 mg/dL (ref 2.4–7.0)

## 2022-05-25 LAB — VITAMIN B12: Vitamin B-12: 574 pg/mL (ref 211–911)

## 2022-05-25 LAB — TSH: TSH: 1.12 u[IU]/mL (ref 0.35–5.50)

## 2022-05-25 LAB — VITAMIN D 25 HYDROXY (VIT D DEFICIENCY, FRACTURES): VITD: 81.97 ng/mL (ref 30.00–100.00)

## 2022-05-25 LAB — C-REACTIVE PROTEIN: CRP: <1 mg/dL (ref 0.5–20.0)

## 2022-05-25 LAB — IBC PANEL
Iron: 108 ug/dL (ref 42–145)
Saturation Ratios: 27.2 % (ref 20.0–50.0)
TIBC: 397.6 ug/dL (ref 250.0–450.0)
Transferrin: 284 mg/dL (ref 212.0–360.0)

## 2022-05-25 LAB — SEDIMENTATION RATE: Sed Rate: 8 mm/hr (ref 0–30)

## 2022-05-25 LAB — FERRITIN: Ferritin: 39.2 ng/mL (ref 10.0–291.0)

## 2022-05-25 MED ORDER — PREDNISONE 20 MG PO TABS: 40.0000 mg | ORAL_TABLET | Freq: Every day | ORAL | 0 refills | Status: DC

## 2022-05-25 NOTE — Patient Instructions (Addendum)
Thoracic and lumbar xray Thoracic and lumbar MRI [id number] Prednisone 40mg  for 5 days Lab work today See me in 6 weeks

## 2022-05-25 NOTE — Assessment & Plan Note (Signed)
Patient does have pain that is out of proportion of the lumbar spine in the thoracic area especially in the scapular area.  Patient has failed all other conservative therapy including osteopathic manipulation, oral anti-inflammatories, gabapentin, and physical therapy.  I do feel at this point that the possibility of advanced imaging is warranted to rule out an occult fracture, nerve root impingement that could be also contributing to the pain.  Patient will follow-up with me again in 6 to 8 weeks otherwise.

## 2022-05-27 LAB — CALCIUM, IONIZED: Calcium, Ion: 5 mg/dL (ref 4.7–5.5)

## 2022-05-27 LAB — CYCLIC CITRUL PEPTIDE ANTIBODY, IGG: Cyclic Citrullin Peptide Ab: <16 UNITS

## 2022-05-27 LAB — ANGIOTENSIN CONVERTING ENZYME: Angiotensin-Converting Enzyme: 37 U/L (ref 9–67)

## 2022-05-27 LAB — D-DIMER, QUANTITATIVE: D-Dimer, Quant: 0.35 mcg/mL FEU (ref <0.50)

## 2022-05-27 LAB — RHEUMATOID FACTOR: Rheumatoid fact SerPl-aCnc: <14 IU/mL (ref <14)

## 2022-05-27 LAB — ANTI-NUCLEAR AB-TITER (ANA TITER): ANA Titer 1: 1:40 {titer} — ABNORMAL HIGH

## 2022-05-27 LAB — PTH, INTACT AND CALCIUM
Calcium: 9.4 mg/dL (ref 8.6–10.4)
PTH: 33 pg/mL (ref 16–77)

## 2022-05-27 LAB — ANA: Anti Nuclear Antibody (ANA): POSITIVE — AB

## 2022-06-01 ENCOUNTER — Telehealth: Payer: Self-pay | Admitting: *Deleted

## 2022-06-01 NOTE — Chronic Care Management (AMB) (Signed)
  Care Coordination   Note   06/01/2022 Name: Zilpha Mcandrew MRN: [id number] DOB: [birth year]  Taeja Debellis is a 61 y.o. year old female who sees [facility], Jeannett Senior, MD for primary care. I reached out to Leighton Parody by phone today to offer care coordination services.  Ms. Bai was given information about Care Coordination services today including:   The Care Coordination services include support from the care team which includes your Nurse Coordinator, Clinical Social Worker, or Pharmacist.  The Care Coordination team is here to help remove barriers to the health concerns and goals most important to you. Care Coordination services are voluntary, and the patient may decline or stop services at any time by request to their care team member.   Care Coordination Consent Status: Patient agreed to services and verbal consent obtained.   Follow up plan:  Telephone appointment with care coordination team member scheduled for:  06/03/22  Encounter Outcome:  Pt. Scheduled  [facility] Coordination Care Guide  Direct Dial: [phone]

## 2022-06-03 ENCOUNTER — Ambulatory Visit: Payer: Self-pay

## 2022-06-03 NOTE — Patient Outreach (Signed)
  Care Coordination   06/03/2022 Name: Stephanie Hall MRN: [id number] DOB: 1961-09-27   Care Coordination Outreach Attempts:  An unsuccessful telephone outreach was attempted today to offer the patient information about available care coordination services as a benefit of their health plan.   Follow Up Plan:  Additional outreach attempts will be made to offer the patient care coordination information and services.   Encounter Outcome:  No Answer  Care Coordination Interventions Activated:  No   Care Coordination Interventions:  No, not indicated    Delsa Sale, RN, BSN, CCM Care Management Coordinator [facility] Care Management Direct Phone: [phone]

## 2022-06-03 NOTE — Patient Instructions (Signed)
Visit Information  Thank you for taking time to visit with me today. Please don't hesitate to contact me if I can be of assistance to you.   Following are the goals we discussed today:   Goals Addressed       Patient Stated     I would like to lose some weight (pt-stated)        Care Coordination Interventions: Advised patient to discuss with primary care provider options regarding weight management Provided patient and/or caregiver with contact information about [facility] (community resource or dietician) Provided verbal and/or written education to patient re: provider recommended life style modifications  Advised patient to discuss referral to the PREP program with provider Mailed printed educational materials related Getting Started with Losing Weight; Elderly Nutrition 101; Chair Exercises     Our next appointment is by telephone on 08/03/22 at 3:30 PM   Please call the care guide team at [phone] if you need to cancel or reschedule your appointment.   If you are experiencing a Mental Health or Behavioral Health Crisis or need someone to talk to, please call 1-800-273-TALK (toll free, 24 hour hotline)  Patient verbalizes understanding of instructions and care plan provided today and agrees to view in MyChart. Active MyChart status and patient understanding of how to access instructions and care plan via MyChart confirmed with patient.     Delsa Sale, RN, BSN, CCM Care Management Coordinator [facility] Care Management Direct Phone: [phone]

## 2022-06-03 NOTE — Patient Outreach (Signed)
  Care Coordination   Initial Visit Note   06/03/2022 Name: Emari Hreha MRN: [id number] DOB: 05/15/1961  Arria Naim is a 61 y.o. year old female who sees [facility], Jeannett Senior, MD for primary care. I spoke with  Leighton Parody by phone today  What matters to the patients health and wellness today?  Patient would like to focus on losing weight.     Goals Addressed       Patient Stated     I would like to lose some weight (pt-stated)        Care Coordination Interventions: Advised patient to discuss with primary care provider options regarding weight management Provided patient and/or caregiver with contact information about [facility] (community resource or dietician) Provided verbal and/or written education to patient re: provider recommended life style modifications  Advised patient to discuss referral to the PREP program with provider Mailed printed educational materials related Getting Started with Losing Weight; Elderly Nutrition 101; Chair Exercises      SDOH assessments and interventions completed:  Yes  SDOH Interventions Today    Flowsheet Row Most Recent Value  SDOH Interventions   Financial Strain Interventions Other (Comment)  [patient will continue to work with established pharm D and drug company]        Care Coordination Interventions Activated:  Yes  Care Coordination Interventions:  Yes, provided   Follow up plan: Follow up call scheduled for 08/03/22 @3 :30 PM     Encounter Outcome:  Pt. Visit Completed

## 2022-06-19 ENCOUNTER — Other Ambulatory Visit: Payer: Self-pay | Admitting: Endocrinology

## 2022-06-19 DIAGNOSIS — E785 Hyperlipidemia, unspecified: Secondary | ICD-10-CM | POA: Diagnosis not present

## 2022-06-19 DIAGNOSIS — K5904 Chronic idiopathic constipation: Secondary | ICD-10-CM

## 2022-06-22 ENCOUNTER — Other Ambulatory Visit: Payer: Self-pay | Admitting: Obstetrics and Gynecology

## 2022-06-22 DIAGNOSIS — Z01419 Encounter for gynecological examination (general) (routine) without abnormal findings: Secondary | ICD-10-CM | POA: Diagnosis not present

## 2022-06-22 DIAGNOSIS — Z6832 Body mass index (BMI) 32.0-32.9, adult: Secondary | ICD-10-CM | POA: Diagnosis not present

## 2022-06-22 DIAGNOSIS — Z1272 Encounter for screening for malignant neoplasm of vagina: Secondary | ICD-10-CM | POA: Diagnosis not present

## 2022-06-22 DIAGNOSIS — N644 Mastodynia: Secondary | ICD-10-CM

## 2022-06-25 ENCOUNTER — Telehealth: Payer: Self-pay

## 2022-06-25 DIAGNOSIS — M180 Bilateral primary osteoarthritis of first carpometacarpal joints: Secondary | ICD-10-CM | POA: Diagnosis not present

## 2022-06-25 DIAGNOSIS — R2231 Localized swelling, mass and lump, right upper limb: Secondary | ICD-10-CM | POA: Diagnosis not present

## 2022-06-25 MED ORDER — PREDNISONE 50 MG PO TABS: ORAL_TABLET | ORAL | 0 refills | Status: DC

## 2022-06-25 MED ORDER — DIPHENHYDRAMINE HCL 50 MG PO TABS: 50.0000 mg | ORAL_TABLET | Freq: Once | ORAL | 0 refills | Status: DC

## 2022-06-25 NOTE — Telephone Encounter (Signed)
Spoke with patient to confirm pharmacy and to explain 13-hour prep instructions. Pt to take 50 mg of prednisone on 06/30/22 at 12:01am, 50 mg of prednisone on 06/30/22 at 6:00am, and 50 mg of prednisone on 06/30/22 at 1200pm. Pt is also to take 50 mg of benadryl on 06/30/22 at 1200pm. This was e-scribed and verified by phone call to [facility] on [facility]. Please call [phone] with any questions.

## 2022-06-30 ENCOUNTER — Ambulatory Visit
Admission: RE | Admit: 2022-06-30 | Discharge: 2022-06-30 | Disposition: A | Discharge status: Home / Self Care | Payer: BC Managed Care – PPO | Source: Ambulatory Visit | Attending: Endocrinology | Admitting: Endocrinology

## 2022-06-30 DIAGNOSIS — K5730 Diverticulosis of large intestine without perforation or abscess without bleeding: Secondary | ICD-10-CM | POA: Diagnosis not present

## 2022-06-30 DIAGNOSIS — Q8909 Congenital malformations of spleen: Secondary | ICD-10-CM | POA: Diagnosis not present

## 2022-06-30 DIAGNOSIS — K3189 Other diseases of stomach and duodenum: Secondary | ICD-10-CM | POA: Diagnosis not present

## 2022-06-30 DIAGNOSIS — K5900 Constipation, unspecified: Secondary | ICD-10-CM | POA: Diagnosis not present

## 2022-06-30 DIAGNOSIS — K5904 Chronic idiopathic constipation: Secondary | ICD-10-CM

## 2022-06-30 MED ORDER — IOPAMIDOL (ISOVUE-300) INJECTION 61%
100.0000 mL | Freq: Once | INTRAVENOUS | Status: AC | PRN
  Administered 2022-06-30: 100 mL via INTRAVENOUS

## 2022-07-03 NOTE — Progress Notes (Unsigned)
[name] [address] [place] [address] Phone: [phone] Subjective:   Stephanie Hall, am serving as a scribe for Dr. Antoine Primas.  I'm seeing this patient by the request  of:  Adrian Prince, MD  CC: back and neck pain   [id number]  05/25/2022 Patient does have pain that is out of proportion of the lumbar spine in the thoracic area especially in the scapular area.  Patient has failed all other conservative therapy including osteopathic manipulation, oral anti-inflammatories, gabapentin, and physical therapy.  I do feel at this point that the possibility of advanced imaging is warranted to rule out an occult fracture, nerve root impingement that could be also contributing to the pain.  Patient will follow-up with me again in 6 to 8 weeks otherwise.  Updated 07/06/2022 Stephanie Hall is a 61 y.o. female coming in with complaint of back and neck pain states that is having significant amount of discomfort.  Seems to be worsening overall.  Unable to move any thing on a regular basis.  Affecting daily activities as well as patient's sleep and her job.  At this moment she is looking for anything to give her some relief.  Has failed formal physical therapy  Patient recently did have a CT of the abdomen done that was independently visualized by me showing the patient does have multilevel degenerative disc disease worse from L4-S1 and moderate hip arthritis.  Patient also has moderate stool burden. Patient does have x-rays showing moderate arthritic changes of multiple levels.    Past Medical History:  Diagnosis Date   Hypertension    Past Surgical History:  Procedure Laterality Date   ROTATOR CUFF REPAIR     Social History   Socioeconomic History   Marital status: Married    Spouse name: Not on file   Number of children: Not on file   Years of education: Not on file   Highest education level: Not on file  Occupational History   Not on  file  Tobacco Use   Smoking status: Never   Smokeless tobacco: Never  Substance and Sexual Activity   Alcohol use: Not Currently   Drug use: Not Currently   Sexual activity: Not on file  Other Topics Concern   Not on file  Social History Narrative   Right handed   Social Determinants of Health   Financial Resource Strain: Medium Risk (06/03/2022)   Overall Financial Resource Strain (CARDIA)    Difficulty of Paying Living Expenses: Somewhat hard  Food Insecurity: Not on file  Transportation Needs: Not on file  Physical Activity: Not on file  Stress: Not on file  Social Connections: Not on file   Allergies  Allergen Reactions   Neosporin [Bacitracin-Polymyxin B] Hives and Rash   Adhesive  [Tape] Rash   Contrast Media  [Iodinated Contrast Media] Itching, Dermatitis and Rash   No family history on file.  Current Outpatient Medications (Endocrine & Metabolic):    predniSONE (DELTASONE) 20 MG tablet, Take 2 tablets (40 mg total) by mouth daily with breakfast.   predniSONE (DELTASONE) 50 MG tablet, Pt to take 50 mg of prednisone on 06/30/22 at 12:01am, 50 mg of prednisone on 06/30/22 at 6:00am, and 50 mg of prednisone on 06/30/22 at 1200pm. Pt is also to take 50 mg of benadryl on 06/30/22 at 1200pm. Please call [phone] with any questions.  Current Outpatient Medications (Cardiovascular):    hydrochlorothiazide (HYDRODIURIL) 25 MG tablet, Take 25 mg by mouth daily.  metoprolol succinate (TOPROL-XL) 50 MG 24 hr tablet, Take 1 tablet by mouth 2 (two) times daily.  Current Outpatient Medications (Respiratory):    diphenhydrAMINE (BENADRYL) 50 MG tablet, Take 1 tablet (50 mg total) by mouth once for 1 dose.  Current Outpatient Medications (Analgesics):    rizatriptan (MAXALT-MLT) 10 MG disintegrating tablet, Take 1 tablet earliest onset of migraine.  May repeat in 2 hours if needed. Maximum 2 tablets in 24 hours.   Current Outpatient Medications (Other):    ALPRAZolam (XANAX) 0.5  MG tablet, Take 0.5 mg by mouth as needed.   DULoxetine (CYMBALTA) 30 MG capsule, Take 30 mg by mouth daily.   linaclotide (LINZESS) 145 MCG CAPS capsule, Take by mouth.   methocarbamol (ROBAXIN) 500 MG tablet, As needed.   omeprazole (PRILOSEC) 40 MG capsule, Take 1 capsule by mouth daily.   OVER THE COUNTER MEDICATION, Multivitamin-Take 1 table by mouth daily.   traZODone (DESYREL) 50 MG tablet, trazodone 50 mg tablet   Reviewed prior external information including notes and imaging from  primary care provider As well as notes that were available from care everywhere and other healthcare systems.  Past medical history, social, surgical and family history all reviewed in electronic medical record.  No pertanent information unless stated regarding to the chief complaint.   Review of Systems:  No headache, visual changes, nausea, vomiting, diarrhea, constipation, dizziness, abdominal pain, skin rash, fevers, chills, night sweats, weight loss, swollen lymph nodes, body aches, joint swelling, chest pain, shortness of breath, mood changes. POSITIVE muscle aches  Objective  Height 5\' 4"  (1.626 m).   General: No apparent distress alert and oriented x3 mood and affect normal, dressed appropriately.  HEENT: Pupils equal, extraocular movements intact  Respiratory: Patient's speak in full sentences and does not appear short of breath  Cardiovascular: No lower extremity edema, non tender, no erythema  Patient is very highly uncomfortable in her chair at the moment.  Patient has limited sidebending of the neck bilaterally.  Patient does have 10 degrees of extension of the neck only.  Patient does have significant decreased range in the lumbar area.    Impression and Recommendations:    The above documentation has been reviewed and is accurate and complete Stephanie Pulley, DO

## 2022-07-06 ENCOUNTER — Ambulatory Visit (INDEPENDENT_AMBULATORY_CARE_PROVIDER_SITE_OTHER): Payer: BC Managed Care – PPO | Admitting: Family Medicine

## 2022-07-06 VITALS — Ht 64.0 in

## 2022-07-06 DIAGNOSIS — M545 Low back pain, unspecified: Secondary | ICD-10-CM

## 2022-07-06 DIAGNOSIS — M542 Cervicalgia: Secondary | ICD-10-CM

## 2022-07-06 DIAGNOSIS — M159 Polyosteoarthritis, unspecified: Secondary | ICD-10-CM

## 2022-07-06 DIAGNOSIS — M546 Pain in thoracic spine: Secondary | ICD-10-CM

## 2022-07-06 MED ORDER — KETOROLAC TROMETHAMINE 60 MG/2ML IM SOLN
60.0000 mg | Freq: Once | INTRAMUSCULAR | Status: AC
  Administered 2022-07-06: 60 mg via INTRAMUSCULAR

## 2022-07-06 MED ORDER — METHYLPREDNISOLONE ACETATE 80 MG/ML IJ SUSP
80.0000 mg | Freq: Once | INTRAMUSCULAR | Status: AC
  Administered 2022-07-06: 80 mg via INTRAMUSCULAR

## 2022-07-06 NOTE — Patient Instructions (Signed)
[facility] [phone] We will be in touch

## 2022-07-06 NOTE — Assessment & Plan Note (Addendum)
Patient has known degenerative disc disease at multiple levels and has failed everything including prednisone, muscle relaxers, Toradol and Depo-Medrol given today. Failed all conservative therapy.  Discussed which activities to do Will order MRi to furthe revaluate.  which ones to avoid.  Follow-up with me again in 6 to 8 weeks.

## 2022-07-09 ENCOUNTER — Ambulatory Visit
Admission: RE | Admit: 2022-07-09 | Discharge: 2022-07-09 | Disposition: A | Discharge status: Home / Self Care | Payer: BC Managed Care – PPO | Source: Ambulatory Visit | Attending: Obstetrics and Gynecology | Admitting: Obstetrics and Gynecology

## 2022-07-09 DIAGNOSIS — N644 Mastodynia: Secondary | ICD-10-CM

## 2022-07-23 NOTE — Progress Notes (Deleted)
[place] Phone: [phone] Subjective:    I'm seeing this patient by the request  of:  Reynold Bowen, MD  CC:   [id number]  07/06/2022 Patient has known degenerative disc disease at multiple levels and has failed everything including prednisone, muscle relaxers, Toradol and Depo-Medrol given today. Failed all conservative therapy.  Discussed which activities to do Will order MRi to furthe revaluate.  which ones to avoid.  Follow-up with me again in 6 to 8 weeks.  Updated 07/29/2022 Stephanie Hall is a 61 y.o. female coming in with complaint of DJD.       Past Medical History:  Diagnosis Date   Hypertension    Past Surgical History:  Procedure Laterality Date   REDUCTION MAMMAPLASTY Bilateral    2-3 years ago   ROTATOR CUFF REPAIR     Social History   Socioeconomic History   Marital status: Married    Spouse name: Not on file   Number of children: Not on file   Years of education: Not on file   Highest education level: Not on file  Occupational History   Not on file  Tobacco Use   Smoking status: Never   Smokeless tobacco: Never  Substance and Sexual Activity   Alcohol use: Not Currently   Drug use: Not Currently   Sexual activity: Not on file  Other Topics Concern   Not on file  Social History Narrative   Right handed   Social Determinants of Health   Financial Resource Strain: Medium Risk (06/03/2022)   Overall Financial Resource Strain (CARDIA)    Difficulty of Paying Living Expenses: Somewhat hard  Food Insecurity: Not on file  Transportation Needs: Not on file  Physical Activity: Not on file  Stress: Not on file  Social Connections: Not on file   Allergies  Allergen Reactions   Neosporin [Bacitracin-Polymyxin B] Hives and Rash   Adhesive  [Tape] Rash   Contrast Media  [Iodinated Contrast Media] Itching, Dermatitis and Rash   No family history on file.  Current  Outpatient Medications (Endocrine & Metabolic):    predniSONE (DELTASONE) 20 MG tablet, Take 2 tablets (40 mg total) by mouth daily with breakfast.   predniSONE (DELTASONE) 50 MG tablet, Pt to take 50 mg of prednisone on 06/30/22 at 12:01am, 50 mg of prednisone on 06/30/22 at 6:00am, and 50 mg of prednisone on 06/30/22 at 1200pm. Pt is also to take 50 mg of benadryl on 06/30/22 at 1200pm. Please call [phone] with any questions.  Current Outpatient Medications (Cardiovascular):    hydrochlorothiazide (HYDRODIURIL) 25 MG tablet, Take 25 mg by mouth daily.   metoprolol succinate (TOPROL-XL) 50 MG 24 hr tablet, Take 1 tablet by mouth 2 (two) times daily.  Current Outpatient Medications (Respiratory):    diphenhydrAMINE (BENADRYL) 50 MG tablet, Take 1 tablet (50 mg total) by mouth once for 1 dose.  Current Outpatient Medications (Analgesics):    rizatriptan (MAXALT-MLT) 10 MG disintegrating tablet, Take 1 tablet earliest onset of migraine.  May repeat in 2 hours if needed. Maximum 2 tablets in 24 hours.   Current Outpatient Medications (Other):    ALPRAZolam (XANAX) 0.5 MG tablet, Take 0.5 mg by mouth as needed.   DULoxetine (CYMBALTA) 30 MG capsule, Take 30 mg by mouth daily.   linaclotide (LINZESS) 145 MCG CAPS capsule, Take by mouth.   methocarbamol (ROBAXIN) 500 MG tablet, As needed.   omeprazole (PRILOSEC) 40 MG capsule, Take 1 capsule  by mouth daily.   OVER THE COUNTER MEDICATION, Multivitamin-Take 1 table by mouth daily.   traZODone (DESYREL) 50 MG tablet, trazodone 50 mg tablet   Reviewed prior external information including notes and imaging from  primary care provider As well as notes that were available from care everywhere and other healthcare systems.  Past medical history, social, surgical and family history all reviewed in electronic medical record.  No pertanent information unless stated regarding to the chief complaint.   Review of Systems:  No headache, visual changes,  nausea, vomiting, diarrhea, constipation, dizziness, abdominal pain, skin rash, fevers, chills, night sweats, weight loss, swollen lymph nodes, body aches, joint swelling, chest pain, shortness of breath, mood changes. POSITIVE muscle aches  Objective  There were no vitals taken for this visit.   General: No apparent distress alert and oriented x3 mood and affect normal, dressed appropriately.  HEENT: Pupils equal, extraocular movements intact  Respiratory: Patient's speak in full sentences and does not appear short of breath  Cardiovascular: No lower extremity edema, non tender, no erythema      Impression and Recommendations:

## 2022-07-24 ENCOUNTER — Ambulatory Visit
Admission: RE | Admit: 2022-07-24 | Discharge: 2022-07-24 | Disposition: A | Discharge status: Home / Self Care | Payer: BC Managed Care – PPO | Source: Ambulatory Visit | Attending: Family Medicine | Admitting: Family Medicine

## 2022-07-24 DIAGNOSIS — M4802 Spinal stenosis, cervical region: Secondary | ICD-10-CM | POA: Diagnosis not present

## 2022-07-24 DIAGNOSIS — M48061 Spinal stenosis, lumbar region without neurogenic claudication: Secondary | ICD-10-CM | POA: Diagnosis not present

## 2022-07-24 DIAGNOSIS — M545 Low back pain, unspecified: Secondary | ICD-10-CM

## 2022-07-24 DIAGNOSIS — M4316 Spondylolisthesis, lumbar region: Secondary | ICD-10-CM | POA: Diagnosis not present

## 2022-07-24 DIAGNOSIS — M40204 Unspecified kyphosis, thoracic region: Secondary | ICD-10-CM | POA: Diagnosis not present

## 2022-07-24 DIAGNOSIS — M549 Dorsalgia, unspecified: Secondary | ICD-10-CM | POA: Diagnosis not present

## 2022-07-24 DIAGNOSIS — M4126 Other idiopathic scoliosis, lumbar region: Secondary | ICD-10-CM | POA: Diagnosis not present

## 2022-07-24 DIAGNOSIS — M542 Cervicalgia: Secondary | ICD-10-CM

## 2022-07-24 DIAGNOSIS — M546 Pain in thoracic spine: Secondary | ICD-10-CM

## 2022-07-28 ENCOUNTER — Encounter: Payer: Self-pay | Admitting: Family Medicine

## 2022-07-28 ENCOUNTER — Other Ambulatory Visit: Payer: Self-pay

## 2022-07-28 DIAGNOSIS — M5412 Radiculopathy, cervical region: Secondary | ICD-10-CM

## 2022-07-29 ENCOUNTER — Telehealth: Payer: Self-pay

## 2022-07-29 ENCOUNTER — Ambulatory Visit: Payer: BC Managed Care – PPO | Admitting: Family Medicine

## 2022-07-29 MED ORDER — PREDNISONE 50 MG PO TABS: ORAL_TABLET | ORAL | 0 refills | Status: DC

## 2022-07-29 MED ORDER — DIPHENHYDRAMINE HCL 50 MG PO TABS: 50.0000 mg | ORAL_TABLET | Freq: Once | ORAL | 0 refills | Status: DC

## 2022-07-29 NOTE — Telephone Encounter (Signed)
Phone call to patient to review instructions for 13 hr prep for injection w/ contrast on 08/11/22 at 10:00 AM. Prescription called into [facility]. Pt aware and verbalized understanding of instructions.  Prescription: Pt to take 50 mg of prednisone on 08/10/22 at 9:00 PM, 50 mg of prednisone on 08/11/22 at 3:00 AM, and 50 mg of prednisone on 08/11/22 at 9:00 AM. Pt is also to take 50 mg of benadryl on 08/11/22 at 9:00 AM. Please call [phone] with any questions.

## 2022-08-03 ENCOUNTER — Ambulatory Visit: Payer: Self-pay

## 2022-08-03 NOTE — Patient Outreach (Signed)
  Care Coordination   08/03/2022 Name: Stephanie Hall MRN: [id number] DOB: 04/30/1961   Care Coordination Outreach Attempts:  An unsuccessful telephone outreach was attempted for a scheduled appointment today.  Follow Up Plan:  No further outreach attempts will be made at this time. We have been unable to contact the patient to offer or enroll patient in care coordination services  Encounter Outcome:  No Answer  Care Coordination Interventions Activated:  No   Care Coordination Interventions:  No, not indicated    Barb Merino, RN, BSN, CCM Care Management Coordinator [facility]  Direct Phone: [phone]

## 2022-08-06 DIAGNOSIS — L821 Other seborrheic keratosis: Secondary | ICD-10-CM | POA: Diagnosis not present

## 2022-08-06 DIAGNOSIS — L814 Other melanin hyperpigmentation: Secondary | ICD-10-CM | POA: Diagnosis not present

## 2022-08-06 DIAGNOSIS — D229 Melanocytic nevi, unspecified: Secondary | ICD-10-CM | POA: Diagnosis not present

## 2022-08-06 DIAGNOSIS — L570 Actinic keratosis: Secondary | ICD-10-CM | POA: Diagnosis not present

## 2022-08-11 ENCOUNTER — Ambulatory Visit
Admission: RE | Admit: 2022-08-11 | Discharge: 2022-08-11 | Disposition: A | Discharge status: Home / Self Care | Payer: BC Managed Care – PPO | Source: Ambulatory Visit | Attending: Family Medicine | Admitting: Family Medicine

## 2022-08-11 DIAGNOSIS — M5412 Radiculopathy, cervical region: Secondary | ICD-10-CM

## 2022-08-11 DIAGNOSIS — M47812 Spondylosis without myelopathy or radiculopathy, cervical region: Secondary | ICD-10-CM | POA: Diagnosis not present

## 2022-08-11 MED ORDER — IOPAMIDOL (ISOVUE-M 300) INJECTION 61%
1.0000 mL | Freq: Once | INTRAMUSCULAR | Status: AC
  Administered 2022-08-11: 1 mL via EPIDURAL

## 2022-08-11 MED ORDER — TRIAMCINOLONE ACETONIDE 40 MG/ML IJ SUSP (RADIOLOGY)
60.0000 mg | Freq: Once | INTRAMUSCULAR | Status: AC
  Administered 2022-08-11: 60 mg via EPIDURAL

## 2022-08-11 NOTE — Discharge Instructions (Signed)

## 2022-08-28 DIAGNOSIS — I10 Essential (primary) hypertension: Secondary | ICD-10-CM | POA: Diagnosis not present

## 2022-09-14 ENCOUNTER — Telehealth: Payer: Self-pay | Admitting: *Deleted

## 2022-09-14 NOTE — Progress Notes (Signed)
  Care Coordination Note  09/14/2022 Name: Stephanie Hall MRN: [id number] DOB: [birth year]  Stephanie Hall is a 61 y.o. year old female who is a primary care patient of Adrian Prince, MD and is actively engaged with the care management team. I reached out to Leighton Parody by phone today to assist with re-scheduling a follow up visit with the RN Case Manager  Follow up plan: Unsuccessful telephone outreach attempt made. A HIPAA compliant phone message was left for the patient providing contact information and requesting a return call.   [facility]  Care Coordination Care Guide  Direct Dial: [phone]

## 2022-09-23 NOTE — Progress Notes (Signed)
  Care Coordination Note  09/23/2022 Name: [name] MRN: [id number] DOB: 28-Mar-1961  Stephanie Hall is a 61 y.o. year old female who is a primary care patient of Adrian Prince, MD and is actively engaged with the care management team. I reached out to Leighton Parody by phone today to assist with re-scheduling a follow up visit with the RN Case Manager  Follow up plan: Unsuccessful telephone outreach attempt made. A HIPAA compliant phone message was left for the patient providing contact information and requesting a return call.  We have been unable to make contact with the patient for follow up. The care management team is available to follow up with the patient after provider conversation with the patient regarding recommendation for care management engagement and subsequent re-referral to the care management team.   [facility] Coordination Care Guide  Direct Dial: [phone]

## 2022-09-28 DIAGNOSIS — J189 Pneumonia, unspecified organism: Secondary | ICD-10-CM | POA: Diagnosis not present

## 2022-09-28 DIAGNOSIS — R051 Acute cough: Secondary | ICD-10-CM | POA: Diagnosis not present

## 2022-09-28 DIAGNOSIS — R0981 Nasal congestion: Secondary | ICD-10-CM | POA: Diagnosis not present

## 2022-10-20 ENCOUNTER — Other Ambulatory Visit: Payer: Self-pay | Admitting: Registered Nurse

## 2022-10-20 DIAGNOSIS — J189 Pneumonia, unspecified organism: Secondary | ICD-10-CM

## 2022-10-20 DIAGNOSIS — R051 Acute cough: Secondary | ICD-10-CM

## 2022-10-20 DIAGNOSIS — R9389 Abnormal findings on diagnostic imaging of other specified body structures: Secondary | ICD-10-CM

## 2022-10-22 ENCOUNTER — Other Ambulatory Visit (HOSPITAL_COMMUNITY): Payer: Self-pay | Admitting: Endocrinology

## 2022-10-22 DIAGNOSIS — J984 Other disorders of lung: Secondary | ICD-10-CM

## 2022-10-23 ENCOUNTER — Encounter (HOSPITAL_COMMUNITY): Payer: Self-pay

## 2022-10-23 ENCOUNTER — Ambulatory Visit (HOSPITAL_COMMUNITY): Admission: RE | Admit: 2022-10-23 | Payer: BC Managed Care – PPO | Source: Ambulatory Visit

## 2022-10-24 ENCOUNTER — Other Ambulatory Visit: Payer: Self-pay | Admitting: Neurology

## 2022-10-26 ENCOUNTER — Other Ambulatory Visit (HOSPITAL_BASED_OUTPATIENT_CLINIC_OR_DEPARTMENT_OTHER): Payer: Self-pay | Admitting: Endocrinology

## 2022-10-27 ENCOUNTER — Telehealth (HOSPITAL_BASED_OUTPATIENT_CLINIC_OR_DEPARTMENT_OTHER): Payer: Self-pay

## 2022-10-27 ENCOUNTER — Ambulatory Visit (HOSPITAL_BASED_OUTPATIENT_CLINIC_OR_DEPARTMENT_OTHER)
Admission: RE | Admit: 2022-10-27 | Discharge: 2022-10-27 | Disposition: A | Discharge status: Home / Self Care | Payer: BC Managed Care – PPO | Source: Ambulatory Visit | Attending: Registered Nurse | Admitting: Registered Nurse

## 2022-10-27 DIAGNOSIS — J479 Bronchiectasis, uncomplicated: Secondary | ICD-10-CM | POA: Diagnosis not present

## 2022-10-27 DIAGNOSIS — J984 Other disorders of lung: Secondary | ICD-10-CM | POA: Insufficient documentation

## 2022-10-27 DIAGNOSIS — R918 Other nonspecific abnormal finding of lung field: Secondary | ICD-10-CM | POA: Diagnosis not present

## 2022-11-04 ENCOUNTER — Ambulatory Visit (INDEPENDENT_AMBULATORY_CARE_PROVIDER_SITE_OTHER): Payer: BC Managed Care – PPO | Admitting: Family Medicine

## 2022-11-04 VITALS — BP 118/88 | HR 86 | Ht 64.0 in | Wt 180.0 lb

## 2022-11-04 DIAGNOSIS — M9901 Segmental and somatic dysfunction of cervical region: Secondary | ICD-10-CM

## 2022-11-04 DIAGNOSIS — M9904 Segmental and somatic dysfunction of sacral region: Secondary | ICD-10-CM

## 2022-11-04 DIAGNOSIS — M9908 Segmental and somatic dysfunction of rib cage: Secondary | ICD-10-CM

## 2022-11-04 DIAGNOSIS — M5459 Other low back pain: Secondary | ICD-10-CM

## 2022-11-04 DIAGNOSIS — M5136 Other intervertebral disc degeneration, lumbar region: Secondary | ICD-10-CM

## 2022-11-04 DIAGNOSIS — M51369 Other intervertebral disc degeneration, lumbar region without mention of lumbar back pain or lower extremity pain: Secondary | ICD-10-CM

## 2022-11-04 DIAGNOSIS — M9903 Segmental and somatic dysfunction of lumbar region: Secondary | ICD-10-CM

## 2022-11-04 DIAGNOSIS — M47812 Spondylosis without myelopathy or radiculopathy, cervical region: Secondary | ICD-10-CM

## 2022-11-04 DIAGNOSIS — M9902 Segmental and somatic dysfunction of thoracic region: Secondary | ICD-10-CM

## 2022-11-04 MED ORDER — GABAPENTIN 100 MG PO CAPS: 200.0000 mg | ORAL_CAPSULE | Freq: Every day | ORAL | 0 refills | Status: DC

## 2022-11-04 NOTE — Progress Notes (Signed)
[facility] [facility] [place] [address] [place] Phone: [phone] Subjective:   IVilma Meckel, am serving as a scribe for Dr. Hulan Saas.  I'm seeing this patient by the request  of:  Reynold Bowen, MD  CC: neck pain   [id number]  07/06/2022 Patient has known degenerative disc disease at multiple levels and has failed everything including prednisone, muscle relaxers, Toradol and Depo-Medrol given today. Failed all conservative therapy.  Discussed which activities to do Will order MRi to furthe revaluate.  which ones to avoid.  Follow-up with me again in 6 to 8 weeks.     Updated 11/04/2021 Codi Folkerts is a 62 y.o. female coming in with complaint of neck pain. Epi was 08/11/2022. Neck doing a little worse. Has been getting headaches in the morning. Would like to go over CT since we wasn't able to come in December. She had pneumonia.  Patient has been having more difficulty than anything else.  States that it does seem to affect daily activities.  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     Past Medical History:  Diagnosis Date   Hypertension    Past Surgical History:  Procedure Laterality Date   REDUCTION MAMMAPLASTY Bilateral    2-3 years ago   ROTATOR CUFF REPAIR     Social History   Socioeconomic History   Marital status: Married    Spouse name: Not on file   Number of children: Not on file   Years of education: Not on file   Highest education level: Not on file  Occupational History   Not on file  Tobacco Use   Smoking status: Never   Smokeless tobacco: Never  Substance and Sexual Activity   Alcohol use: Not Currently   Drug use: Not Currently   Sexual activity: Not on file  Other Topics Concern   Not on file  Social History Narrative   Right handed   Social Determinants of Health   Financial Resource Strain: Medium Risk (06/03/2022)   Overall  Financial Resource Strain (CARDIA)    Difficulty of Paying Living Expenses: Somewhat hard  Food Insecurity: Not on file  Transportation Needs: Not on file  Physical Activity: Not on file  Stress: Not on file  Social Connections: Not on file   Allergies  Allergen Reactions   Neosporin [Bacitracin-Polymyxin B] Hives and Rash   Adhesive  [Tape] Rash   Contrast Media  [Iodinated Contrast Media] Itching, Dermatitis and Rash   No family history on file.  Current Outpatient Medications (Endocrine & Metabolic):    predniSONE (DELTASONE) 20 MG tablet, Take 2 tablets (40 mg total) by mouth daily with breakfast.   predniSONE (DELTASONE) 50 MG tablet, Pt to take 50 mg of prednisone on 06/30/22 at 12:01am, 50 mg of prednisone on 06/30/22 at 6:00am, and 50 mg of prednisone on 06/30/22 at 1200pm. Pt is also to take 50 mg of benadryl on 06/30/22 at 1200pm. Please call [phone] with any questions.   predniSONE (DELTASONE) 50 MG tablet, Pt to take 50 mg of prednisone on 08/10/22 at 9:00 PM, 50 mg of prednisone on 08/11/22 at 3:00 AM, and 50 mg of prednisone on 08/11/22 at 9:00 AM. Pt is also to take 50 mg of benadryl on 08/11/22 at 9:00 AM. Please call [phone] with any questions.  Current Outpatient Medications (Cardiovascular):    hydrochlorothiazide (HYDRODIURIL) 25 MG tablet, Take 25  mg by mouth daily.   metoprolol succinate (TOPROL-XL) 50 MG 24 hr tablet, Take 1 tablet by mouth 2 (two) times daily.  Current Outpatient Medications (Respiratory):    diphenhydrAMINE (BENADRYL) 50 MG tablet, Take 1 tablet (50 mg total) by mouth once for 1 dose.   diphenhydrAMINE (BENADRYL) 50 MG tablet, Take 1 tablet (50 mg total) by mouth once for 1 dose. Pt to take 50 mg of benadryl on 08/11/22 at 9:00 AM. Please call [phone] with any questions.  Current Outpatient Medications (Analgesics):    rizatriptan (MAXALT-MLT) 10 MG disintegrating tablet, TAKE 1 TABLET BY MOUTH EARLIEST ONSET OF MIGRAINE. MAY REPEAT  IN 2 HOURS IF NEEDED. MAXIMUM 2 TABLETS IN 24 HOURS.   Current Outpatient Medications (Other):    gabapentin (NEURONTIN) 100 MG capsule, Take 2 capsules (200 mg total) by mouth at bedtime.   ALPRAZolam (XANAX) 0.5 MG tablet, Take 0.5 mg by mouth as needed.   DULoxetine (CYMBALTA) 30 MG capsule, Take 30 mg by mouth daily.   linaclotide (LINZESS) 145 MCG CAPS capsule, Take by mouth.   methocarbamol (ROBAXIN) 500 MG tablet, As needed.   omeprazole (PRILOSEC) 40 MG capsule, Take 1 capsule by mouth daily.   OVER THE COUNTER MEDICATION, Multivitamin-Take 1 table by mouth daily.   traZODone (DESYREL) 50 MG tablet, trazodone 50 mg tablet   Reviewed prior external information including notes and imaging from  primary care provider As well as notes that were available from care everywhere and other healthcare systems.  Reviewed patient's most recent CT scan of the chest where patient likely does have some interstitial lung disease and fibrosis noted.  Past medical history, social, surgical and family history all reviewed in electronic medical record.  No pertanent information unless stated regarding to the chief complaint.   Review of Systems:  No headache, visual changes, nausea, vomiting, diarrhea, constipation, dizziness, abdominal pain, skin rash, fevers, chills, night sweats, weight loss, swollen lymph nodes, body aches, joint swelling, chest pain, shortness of breath, mood changes. POSITIVE muscle aches  Objective  Blood pressure 118/88, pulse 86, height 5\' 4"  (1.626 m), weight 180 lb (81.6 kg), SpO2 95 %.   General: No apparent distress alert and oriented x3 mood and affect normal, dressed appropriately.  HEENT: Pupils equal, extraocular movements intact  Respiratory: Patient's speak in full sentences and does not appear short of breath  Cardiovascular: No lower extremity edema, non tender, no erythema  Neck exam does have significant loss of lordosis.  Tightness noted at the scene C7  bilaterally. Patient still has some mild weakness with grip strength bilaterally.  Low back exam does have some loss of lordosis noted.  Some tightness noted in the paraspinal musculature left greater than right.  Osteopathic findings C4 flexed rotated and side bent left C6 flexed rotated and side bent left T3 extended rotated and side bent right inhaled third rib T9 extended rotated and side bent left L4 flexed rotated and side bent left  Sacrum right on right       Impression and Recommendations:     The above documentation has been reviewed and is accurate and complete Lyndal Pulley, DO

## 2022-11-04 NOTE — Assessment & Plan Note (Signed)
Degenerative disc disease with what likely is an L4 nerve root impingement noted.  Patient has pain that does seem fairly focused on the wondering if the possibility of a medial branch block would be beneficial for this individual.  We will see if she is a candidate then for radiofrequency ablation.  Gabapentin 200 mg at night. Will try to see how patient responds to osteopathic manipulation as well.  Follow-up again in 6 to 8 weeks

## 2022-11-04 NOTE — Assessment & Plan Note (Signed)
   Decision today to treat with OMT was based on Physical Exam  After verbal consent patient was treated with  ME, FPR techniques in cervical, thoracic, rib, lumbar and sacral areas, all areas are chronic   Patient tolerated the procedure well with improvement in symptoms  Patient given exercises, stretches and lifestyle modifications  See medications in patient instructions if given  Patient will follow up in 4-8 weeks

## 2022-11-04 NOTE — Assessment & Plan Note (Signed)
Patient did not respond as well to the epidural as we were hoping.  Continues to have chronic headaches associated with it as well.  Discussed with patient about icing regimen and home exercises, discussed injections.  1.  Increase activity slowly.  Attempted osteopathic manipulation focusing more on muscle energy than HVLA discussed posture and ergonomics.  Follow-up again in 6 to 8 weeks

## 2022-11-04 NOTE — Patient Instructions (Signed)
MBB L L4 Gabapentin 200mg  at night CoQ10 200 mg  See me again in 6-8 weeks

## 2022-11-05 ENCOUNTER — Encounter: Payer: Self-pay | Admitting: Emergency Medicine

## 2022-11-05 ENCOUNTER — Ambulatory Visit (INDEPENDENT_AMBULATORY_CARE_PROVIDER_SITE_OTHER): Payer: BC Managed Care – PPO | Admitting: Emergency Medicine

## 2022-11-05 VITALS — BP 132/76 | HR 70 | Temp 98.7°F | Ht 64.0 in | Wt 188.2 lb

## 2022-11-05 DIAGNOSIS — J849 Interstitial pulmonary disease, unspecified: Secondary | ICD-10-CM

## 2022-11-05 DIAGNOSIS — G478 Other sleep disorders: Secondary | ICD-10-CM

## 2022-11-05 NOTE — Progress Notes (Signed)
Subjective:    Patient ID: Stephanie Hall, female    DOB: 1961/04/19, 62 y.o.   MRN: [id number]  HPI 62 year old never smoker with a history of hypertension, migraines, left scapular muscle spasms, depression/anxiety. She is here to review an abnormal Ct chest.  She reports that she was well until she developed URI sx in early December, developed SOB, UA noise, some cough. She received abx and steroids was started on symbicort and then breztri, some improvement but still w abnormal voice, some aching w a deep breath, some persistent cough. She does have breakthrough reflux on omeprazole 40, she also deals with chronic nasal congestion, drainage. She has not had covid ever to her knowledge.    CT chest 10/27/2022 reviewed by me showed no infiltrates.  There are peripheral bilateral reticular opacities with some associated traction bronchiectasis.  Unchanged compared with a cardiac CT done 04/20/2020(?also evident to some degree on 2017 abd/pel Ct), not consistent with a UIP pattern.  Small 3 mm left lower lobe pulmonary nodule.  No other nodules.   Review of Systems As per HPI  Past Medical History:  Diagnosis Date   Hypertension      No family history on file.   Social History   Socioeconomic History   Marital status: Married    Spouse name: Not on file   Number of children: Not on file   Years of education: Not on file   Highest education level: Not on file  Occupational History   Not on file  Tobacco Use   Smoking status: Never   Smokeless tobacco: Never  Substance and Sexual Activity   Alcohol use: Not Currently   Drug use: Not Currently   Sexual activity: Not on file  Other Topics Concern   Not on file  Social History Narrative   Right handed   Social Determinants of Health   Financial Resource Strain: Medium Risk (06/03/2022)   Overall Financial Resource Strain (CARDIA)    Difficulty of Paying Living Expenses: Somewhat hard  Food Insecurity: Not on file   Transportation Needs: Not on file  Physical Activity: Not on file  Stress: Not on file  Social Connections: Not on file  Intimate Partner Violence: Not on file    From [place] No military No mold 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   Allergies  Allergen Reactions   Neosporin [Bacitracin-Polymyxin B] Hives and Rash   Adhesive  [Tape] Rash   Contrast Media  [Iodinated Contrast Media] Itching, Dermatitis and Rash     Outpatient Medications Prior to Visit  Medication Sig Dispense Refill   ALPRAZolam (XANAX) 0.5 MG tablet Take 0.5 mg by mouth as needed.     gabapentin (NEURONTIN) 100 MG capsule Take 2 capsules (200 mg total) by mouth at bedtime. 180 capsule 0   hydrochlorothiazide (HYDRODIURIL) 25 MG tablet Take 25 mg by mouth daily.     linaclotide (LINZESS) 145 MCG CAPS capsule Take by mouth.     methocarbamol (ROBAXIN) 500 MG tablet As needed.     metoprolol succinate (TOPROL-XL) 50 MG 24 hr tablet Take 1 tablet by mouth 2 (two) times daily.     OVER THE COUNTER MEDICATION Multivitamin-Take 1 table by mouth daily.     rizatriptan (MAXALT-MLT) 10 MG disintegrating tablet TAKE 1 TABLET BY MOUTH EARLIEST ONSET OF MIGRAINE. MAY REPEAT IN 2 HOURS IF NEEDED. MAXIMUM 2 TABLETS IN 24 HOURS. 9 tablet 0   diphenhydrAMINE (BENADRYL) 50 MG tablet  Take 1 tablet (50 mg total) by mouth once for 1 dose. 1 tablet 0   diphenhydrAMINE (BENADRYL) 50 MG tablet Take 1 tablet (50 mg total) by mouth once for 1 dose. Pt to take 50 mg of benadryl on 08/11/22 at 9:00 AM. Please call [phone] with any questions. 1 tablet 0   DULoxetine (CYMBALTA) 30 MG capsule Take 30 mg by mouth daily.     omeprazole (PRILOSEC) 40 MG capsule Take 1 capsule by mouth daily.     predniSONE (DELTASONE) 20 MG tablet Take 2 tablets (40 mg total) by mouth daily with breakfast. 10 tablet 0   predniSONE (DELTASONE) 50 MG tablet Pt to take 50 mg of prednisone on 06/30/22 at 12:01am, 50 mg of prednisone on 06/30/22 at  6:00am, and 50 mg of prednisone on 06/30/22 at 1200pm. Pt is also to take 50 mg of benadryl on 06/30/22 at 1200pm. Please call [phone] with any questions. 3 tablet 0   predniSONE (DELTASONE) 50 MG tablet Pt to take 50 mg of prednisone on 08/10/22 at 9:00 PM, 50 mg of prednisone on 08/11/22 at 3:00 AM, and 50 mg of prednisone on 08/11/22 at 9:00 AM. Pt is also to take 50 mg of benadryl on 08/11/22 at 9:00 AM. Please call [phone] with any questions. 3 tablet 0   traZODone (DESYREL) 50 MG tablet trazodone 50 mg tablet     No facility-administered medications prior to visit.         Objective:   Physical Exam Vitals:   11/05/22 0958  BP: 132/76  Pulse: 70  Temp: 98.7 F (37.1 C)  TempSrc: Oral  SpO2: 98%  Weight: 188 lb 3.2 oz (85.4 kg)  Height: 5\' 4"  (1.626 m)   Gen: Pleasant, well-nourished, in no distress,  normal affect  ENT: No lesions,  mouth clear,  oropharynx clear, no postnasal drip  Neck: No JVD, no stridor  Lungs: No use of accessory muscles, no crackles or wheezing on normal respiration, no wheeze on forced expiration  Cardiovascular: RRR, heart sounds normal, no murmur or gallops, no peripheral edema  Musculoskeletal: No deformities, no cyanosis or clubbing  Neuro: alert, awake, non focal  Skin: Warm, no lesions or rash      Assessment & Plan:  Upper airway resistance syndrome Vocal changes, upper airway irritation, occasionally noise and cough following URI.  Suspect that the cough, reflux, chronic rhinitis are all driving this.  She will have pulmonary function testing.  We will stop Breztri.  Try to modify contributing factors   Stop [employer] loratadine 10 mg (generic Claritin) once daily until next visit Try starting fluticasone nasal spray, 2 sprays each nostril once daily Temporarily increase your omeprazole to 40 mg twice a day for 2 weeks, then go back to once daily.  Take 1 hour around food. Try to avoid throat clearing if possible.   You can try using sugar-free candy, nonventilated cough drop to help with this. Tried to set aside some time for voice rest Follow Dr. Lamonte Sakai next available after your pulmonary function testing so we can review together  ILD (interstitial lung disease) ([place]) Peripheral reticular pattern found spuriously when she was being evaluated for her recent cough, pharyngitis, pneumonia.  In retrospect has been similar on scans going back several years which is inconsistent with IPF.  Not in a UIP pattern.  She is never had COVID or any significant exposures other than Bovie smoke when she was working in the [place].  We will perform  pulmonary function testing to establish baseline, check autoimmune labs, consider timing of any repeat chest imaging depending on results.  Probably repeat a high-resolution CT scan of the chest in 1 year.   Baltazar Apo, MD, PhD 11/05/2022, 10:40 AM  Pulmonary and Critical Care [phone] or if no answer before 7:00PM call [phone] For any issues after 7:00PM please call eLink [phone]

## 2022-11-05 NOTE — Addendum Note (Signed)
Addended by: Gavin Potters R on: 11/05/2022 10:49 AM   Modules accepted: Orders

## 2022-11-05 NOTE — Assessment & Plan Note (Signed)
Vocal changes, upper airway irritation, occasionally noise and cough following URI.  Suspect that the cough, reflux, chronic rhinitis are all driving this.  She will have pulmonary function testing.  We will stop Breztri.  Try to modify contributing factors   Stop [employer] loratadine 10 mg (generic Claritin) once daily until next visit Try starting fluticasone nasal spray, 2 sprays each nostril once daily Temporarily increase your omeprazole to 40 mg twice a day for 2 weeks, then go back to once daily.  Take 1 hour around food. Try to avoid throat clearing if possible.  You can try using sugar-free candy, nonventilated cough drop to help with this. Tried to set aside some time for voice rest Follow Dr. Lamonte Hall next available after your pulmonary function testing so we can review together

## 2022-11-05 NOTE — Patient Instructions (Signed)
We reviewed your CT scans of the chest today.  We will plan the timing of any repeat scans at your future visit We will arrange for pulmonary function testing We will perform lab work today Stop [employer] loratadine 10 mg (generic Claritin) once daily until next visit Try starting fluticasone nasal spray, 2 sprays each nostril once daily Temporarily increase your omeprazole to 40 mg twice a day for 2 weeks, then go back to once daily.  Take 1 hour around food. Try to avoid throat clearing if possible.  You can try using sugar-free candy, nonventilated cough drop to help with this. Tried to set aside some time for voice rest Follow Dr. Lamonte Sakai next available after your pulmonary function testing so we can review together

## 2022-11-05 NOTE — Assessment & Plan Note (Signed)
Peripheral reticular pattern found spuriously when she was being evaluated for her recent cough, pharyngitis, pneumonia.  In retrospect has been similar on scans going back several years which is inconsistent with IPF.  Not in a UIP pattern.  She is never had COVID or any significant exposures other than Bovie smoke when she was working in the [place].  We will perform pulmonary function testing to establish baseline, check autoimmune labs, consider timing of any repeat chest imaging depending on results.  Probably repeat a high-resolution CT scan of the chest in 1 year.

## 2022-11-06 LAB — HIV ANTIBODY (ROUTINE TESTING W REFLEX): HIV 1&2 Ab, 4th Generation: NONREACTIVE

## 2022-11-07 LAB — ANTI-SMITH ANTIBODY: ENA SM Ab Ser-aCnc: <1 AI

## 2022-11-08 LAB — ANTI-NUCLEAR AB-TITER (ANA TITER): ANA Titer 1: 1:40 {titer} — ABNORMAL HIGH

## 2022-11-08 LAB — SJOGREN'S SYNDROME ANTIBODS(SSA + SSB)
SSA (Ro) (ENA) Antibody, IgG: <1 AI
SSB (La) (ENA) Antibody, IgG: <1 AI

## 2022-11-08 LAB — ANTI-DNA ANTIBODY, DOUBLE-STRANDED: ds DNA Ab: <1 IU/mL

## 2022-11-08 LAB — RHEUMATOID FACTOR: Rheumatoid fact SerPl-aCnc: <14 IU/mL (ref <14)

## 2022-11-08 LAB — ANTI-SCLERODERMA ANTIBODY: Scleroderma (Scl-70) (ENA) Antibody, IgG: <1 AI

## 2022-11-08 LAB — ANA: Anti Nuclear Antibody (ANA): POSITIVE — AB

## 2022-11-08 LAB — JO-1 ANTIBODY-IGG: Jo-1 Autoabs: <1 AI

## 2022-11-09 LAB — ALDOLASE: Aldolase: 4.9 U/L (ref <=8.1)

## 2022-12-10 ENCOUNTER — Ambulatory Visit (INDEPENDENT_AMBULATORY_CARE_PROVIDER_SITE_OTHER): Payer: BC Managed Care – PPO | Admitting: Emergency Medicine

## 2022-12-10 ENCOUNTER — Encounter: Payer: Self-pay | Admitting: Emergency Medicine

## 2022-12-10 VITALS — BP 128/74 | HR 84 | Temp 98.0°F | Ht 64.0 in | Wt 185.5 lb

## 2022-12-10 DIAGNOSIS — J849 Interstitial pulmonary disease, unspecified: Secondary | ICD-10-CM | POA: Diagnosis not present

## 2022-12-10 DIAGNOSIS — G478 Other sleep disorders: Secondary | ICD-10-CM

## 2022-12-10 LAB — PULMONARY FUNCTION TEST
DL/VA % pred: 117 %
DL/VA: 4.91 ml/min/mmHg/L
DLCO cor % pred: 99 %
DLCO cor: 19.82 ml/min/mmHg
DLCO unc % pred: 99 %
DLCO unc: 19.82 ml/min/mmHg
FEF 25-75 Post: 2.18 L/sec
FEF 25-75 Pre: 2.37 L/sec
FEF2575-%Change-Post: -8 %
FEF2575-%Pred-Post: 100 %
FEF2575-%Pred-Pre: 109 %
FEV1-%Change-Post: 0 %
FEV1-%Pred-Post: 96 %
FEV1-%Pred-Pre: 95 %
FEV1-Post: 2.36 L
FEV1-Pre: 2.35 L
FEV1FVC-%Change-Post: -1 %
FEV1FVC-%Pred-Pre: 108 %
FEV6-%Change-Post: 2 %
FEV6-%Pred-Post: 93 %
FEV6-%Pred-Pre: 91 %
FEV6-Post: 2.86 L
FEV6-Pre: 2.79 L
FEV6FVC-%Pred-Post: 104 %
FEV6FVC-%Pred-Pre: 104 %
FVC-%Change-Post: 2 %
FVC-%Pred-Post: 89 %
FVC-%Pred-Pre: 87 %
FVC-Post: 2.86 L
FVC-Pre: 2.79 L
Post FEV1/FVC ratio: 83 %
Post FEV6/FVC ratio: 100 %
Pre FEV1/FVC ratio: 84 %
Pre FEV6/FVC Ratio: 100 %
RV % pred: 47 %
RV: 0.98 L
TLC % pred: 94 %
TLC: 4.76 L

## 2022-12-10 NOTE — Patient Instructions (Addendum)
We reviewed your pulmonary function testing today. We will plan to perform a high-resolution CT scan of your chest to follow interstitial lung disease in January 2025. Please continue your fluticasone nasal spray and loratadine as you have been taking them, at least through the spring allergy season.  You may need to take year-round depending on how your coughing and throat irritation are doing Continue your omeprazole once daily as you have been taking it Follow Dr. Lamonte Sakai in January 2025 after your CT or sooner if you have any problems.

## 2022-12-10 NOTE — Patient Instructions (Signed)
Full PFT performed today. °

## 2022-12-10 NOTE — Assessment & Plan Note (Signed)
Function testing showed evidence for subtle restriction based on the decreased RV.  Baseline established.  Plan to perform a high-resolution CT chest in January 2025.  Her autoimmune labs are negative

## 2022-12-10 NOTE — Progress Notes (Signed)
Subjective:    Patient ID: Stephanie Hall, female    DOB: 06/30/1961, 62 y.o.   MRN: [id number]  HPI 62 year old never smoker with a history of hypertension, migraines, left scapular muscle spasms, depression/anxiety. She is here to review an abnormal Ct chest.  She reports that she was well until she developed URI sx in early December, developed SOB, UA noise, some cough. She received abx and steroids was started on symbicort and then breztri, some improvement but still w abnormal voice, some aching w a deep breath, some persistent cough. She does have breakthrough reflux on omeprazole 40, she also deals with chronic nasal congestion, drainage. She has not had covid ever to her knowledge.   CT chest 10/27/2022 reviewed by me showed no infiltrates.  There are peripheral bilateral reticular opacities with some associated traction bronchiectasis.  Unchanged compared with a cardiac CT done 04/20/2020(?also evident to some degree on 2017 abd/pel Ct), not consistent with a UIP pattern.  Small 3 mm left lower lobe pulmonary nodule.  No other nodules.   ROV 12/10/22 --Stephanie Hall follows up today for history of cough and interstitial lung disease noted on chest imaging.  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  At her last visit 1 month ago I stopped her Judithann Sauger and we work to try and modify her allergic rhinitis, temporarily increased her omeprazole to 40 mg twice a day.  Autoimmune labs were performed which were negative as below.  She underwent pulmonary function testing today.  Today she reports that her cough is better. She still feels a globus sensation. Still feels that she needs to clear her throat.    Labs 11/05/2022: Negative anti-Smith, weak 1:40 ANA, negative ANCA, negative anti-smooth muscle, negative RF, negative Scl-70, negative anti-Jo, negative dsDNA, normal aldolase Labs 05/25/2022: Weak ANA 1:40, ACE level 37,  CCP negative  Pulmonary function testing performed today and reviewed by me show normal spirometry without a bronchodilator response, possible restriction based on the decreased RV (TLC is normal), normal diffusion capacity.   Review of Systems As per HPI  Past Medical History:  Diagnosis Date   Hypertension      No family history on file.   Social History   Socioeconomic History   Marital status: Married    Spouse name: Not on file   Number of children: Not on file   Years of education: Not on file   Highest education level: Not on file  Occupational History   Not on file  Tobacco Use   Smoking status: Never   Smokeless tobacco: Never  Substance and Sexual Activity   Alcohol use: Not Currently   Drug use: Not Currently   Sexual activity: Not on file  Other Topics Concern   Not on file  Social History Narrative   Right handed   Social Determinants of Health   Financial Resource Strain: Medium Risk (06/03/2022)   Overall Financial Resource Strain (CARDIA)    Difficulty of Paying Living Expenses: Somewhat hard  Food Insecurity: Not on file  Transportation Needs: Not on file  Physical Activity: Not on file  Stress: Not on file  Social Connections: Not on file  Intimate Partner Violence: Not on file    From [place] No military 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   Allergies  Allergen Reactions   Neosporin [Bacitracin-Polymyxin B] Hives and Rash  Adhesive  [Tape] Rash   Contrast Media  [Iodinated Contrast Media] Itching, Dermatitis and Rash     Outpatient Medications Prior to Visit  Medication Sig Dispense Refill   ALPRAZolam (XANAX) 0.5 MG tablet Take 0.5 mg by mouth as needed.     gabapentin (NEURONTIN) 100 MG capsule Take 2 capsules (200 mg total) by mouth at bedtime. 180 capsule 0   hydrochlorothiazide (HYDRODIURIL) 25 MG tablet Take 25 mg by mouth daily.     linaclotide (LINZESS) 145 MCG CAPS capsule Take by mouth.      methocarbamol (ROBAXIN) 500 MG tablet As needed.     metoprolol succinate (TOPROL-XL) 50 MG 24 hr tablet Take 1 tablet by mouth 2 (two) times daily.     OVER THE COUNTER MEDICATION Multivitamin-Take 1 table by mouth daily.     rizatriptan (MAXALT-MLT) 10 MG disintegrating tablet TAKE 1 TABLET BY MOUTH EARLIEST ONSET OF MIGRAINE. MAY REPEAT IN 2 HOURS IF NEEDED. MAXIMUM 2 TABLETS IN 24 HOURS. 9 tablet 0   diphenhydrAMINE (BENADRYL) 50 MG tablet Take 1 tablet (50 mg total) by mouth once for 1 dose. 1 tablet 0   diphenhydrAMINE (BENADRYL) 50 MG tablet Take 1 tablet (50 mg total) by mouth once for 1 dose. Pt to take 50 mg of benadryl on 08/11/22 at 9:00 AM. Please call [phone] with any questions. 1 tablet 0   DULoxetine (CYMBALTA) 30 MG capsule Take 30 mg by mouth daily.     omeprazole (PRILOSEC) 40 MG capsule Take 1 capsule by mouth daily.     predniSONE (DELTASONE) 20 MG tablet Take 2 tablets (40 mg total) by mouth daily with breakfast. 10 tablet 0   predniSONE (DELTASONE) 50 MG tablet Pt to take 50 mg of prednisone on 06/30/22 at 12:01am, 50 mg of prednisone on 06/30/22 at 6:00am, and 50 mg of prednisone on 06/30/22 at 1200pm. Pt is also to take 50 mg of benadryl on 06/30/22 at 1200pm. Please call [phone] with any questions. 3 tablet 0   predniSONE (DELTASONE) 50 MG tablet Pt to take 50 mg of prednisone on 08/10/22 at 9:00 PM, 50 mg of prednisone on 08/11/22 at 3:00 AM, and 50 mg of prednisone on 08/11/22 at 9:00 AM. Pt is also to take 50 mg of benadryl on 08/11/22 at 9:00 AM. Please call [phone] with any questions. 3 tablet 0   No facility-administered medications prior to visit.         Objective:   Physical Exam Vitals:   12/10/22 1322  BP: 128/74  Hall: 84  Temp: 98 F (36.7 C)  TempSrc: Oral  SpO2: 98%  Weight: 185 lb 8 oz (84.1 kg)  Height: '5\' 4"'$  (1.626 m)   Gen: Pleasant, well-nourished, in no distress,  normal affect  ENT: No lesions,  mouth clear,  oropharynx  clear, no postnasal drip  Neck: No JVD, no stridor  Lungs: No use of accessory muscles, no crackles or wheezing on normal respiration, no wheeze on forced expiration  Cardiovascular: RRR, heart sounds normal, no murmur or gallops, no peripheral edema  Musculoskeletal: No deformities, no cyanosis or clubbing  Neuro: alert, awake, non focal  Skin: Warm, no lesions or rash      Assessment & Plan:  ILD (interstitial lung disease) ([facility]) Function testing showed evidence for subtle restriction based on the decreased RV.  Baseline established.  Plan to perform a high-resolution CT chest in January 2025.  Her autoimmune labs are negative  Upper airway resistance syndrome Improving, still  has some upper airway irritation.  If this worsens, progresses then we may decide to perform an airway exam by bronchoscopy.  For now continue her allergy regimen, reflux regimen.   Baltazar Apo, MD, PhD 12/10/2022, 1:35 PM [facility] [phone] or if no answer before 7:00PM call [phone] For any issues after 7:00PM please call eLink [phone]

## 2022-12-10 NOTE — Addendum Note (Signed)
Addended by: Gavin Potters R on: 12/10/2022 01:44 PM   Modules accepted: Orders

## 2022-12-10 NOTE — Assessment & Plan Note (Signed)
Improving, still has some upper airway irritation.  If this worsens, progresses then we may decide to perform an airway exam by bronchoscopy.  For now continue her allergy regimen, reflux regimen.

## 2022-12-10 NOTE — Progress Notes (Signed)
Full PFT performed today. °

## 2022-12-15 NOTE — Progress Notes (Unsigned)
[facility] [place] Phone: [phone] Subjective:    Stephanie Hall, am serving as a scribe for Dr. Hulan Saas.  I'm seeing this patient by the request  of:  Reynold Bowen, MD  CC: Low back but also worsening neck and headache pain  [id number]  Stephanie Hall is a 62 y.o. female coming in with complaint of back and neck pain. OMT on 11/04/2022. Patient states her neck is really bad she is starting to have a headache from it. Patient wants to talk about the medial branch block from Dr. Nelva Bush office   Medications patient has been prescribed: Gabapentin  Taking: Yes         Reviewed prior external information including notes and imaging from previsou exam, outside providers and external EMR if available.   As well as notes that were available from care everywhere and other healthcare systems.  Past medical history, social, surgical and family history all reviewed in electronic medical record.  No pertanent information unless stated regarding to the chief complaint.   Past Medical History:  Diagnosis Date   Hypertension     Allergies  Allergen Reactions   Neosporin [Bacitracin-Polymyxin B] Hives and Rash   Adhesive  [Tape] Rash   Contrast Media  [Iodinated Contrast Media] Itching, Dermatitis and Rash     Review of Systems:  No headache, visual changes, nausea, vomiting, diarrhea, constipation, dizziness, abdominal pain, skin rash, fevers, chills, night sweats, weight loss, swollen lymph nodes, body aches, joint swelling, chest pain, shortness of breath, mood changes. POSITIVE muscle aches  Objective  Blood pressure 102/72, pulse 72, height '5\' 4"'$  (1.626 m), weight 180 lb (81.6 kg), SpO2 96 %.   General: No apparent distress alert and oriented x3 mood and affect normal, dressed appropriately.  HEENT: Pupils equal, extraocular movements intact  Respiratory: Patient's speak in full sentences and does  not appear short of breath  Cardiovascular: No lower extremity edema, non tender, no erythema  Patient does have some tenderness to palpation noted in the cervical occipital area.  He does have tightness in the neck as well as tightness in the parascapular region bilaterally.  Mild crepitus with the neck noted.  Osteopathic findings  C2 flexed rotated and side bent right C6 flexed rotated and side bent right  T3 extended rotated and side bent right inhaled rib T8 extended rotated and side bent left L2 flexed rotated and side bent right Sacrum right on right       Assessment and Plan:  DJD (degenerative joint disease) of cervical spine Chronic problem with some increasing tightness.  Likely some of it seems to be more secondary to posture and ergonomics, discussed icing regimen and home exercises, discussed which activities to do and which ones to avoid.  Increase activity slowly.  Follow-up again in 8-10 weeks  DDD (degenerative disc disease), lumbar Degenerative disease noted.  The patient did have a very relatively recent MRI showing nerve impingement.  Seems to be more consistent with patient's symptoms with the L4 on the left side.  Would like to see if patient would respond well to medial branch block and exam see if she is a candidate to radiofrequency ablation.  Patient will be referred to Dr. Nelva Bush for his expertise    Nonallopathic problems  Decision today to treat with OMT was based on Physical Exam  After verbal consent patient was treated with HVLA, ME, FPR techniques in cervical, rib, thoracic, lumbar, and  sacral  areas  Patient tolerated the procedure well with improvement in symptoms  Patient given exercises, stretches and lifestyle modifications  See medications in patient instructions if given  Patient will follow up in 4-8 weeks     The above documentation has been reviewed and is accurate and complete Lyndal Pulley, DO         Note: This dictation  was prepared with Dragon dictation along with smaller phrase technology. Any transcriptional errors that result from this process are unintentional.

## 2022-12-16 ENCOUNTER — Ambulatory Visit (INDEPENDENT_AMBULATORY_CARE_PROVIDER_SITE_OTHER): Payer: BC Managed Care – PPO | Admitting: Family Medicine

## 2022-12-16 VITALS — BP 102/72 | HR 72 | Ht 64.0 in | Wt 180.0 lb

## 2022-12-16 DIAGNOSIS — M9908 Segmental and somatic dysfunction of rib cage: Secondary | ICD-10-CM | POA: Diagnosis not present

## 2022-12-16 DIAGNOSIS — M5136 Other intervertebral disc degeneration, lumbar region: Secondary | ICD-10-CM

## 2022-12-16 DIAGNOSIS — M47812 Spondylosis without myelopathy or radiculopathy, cervical region: Secondary | ICD-10-CM | POA: Diagnosis not present

## 2022-12-16 DIAGNOSIS — M9902 Segmental and somatic dysfunction of thoracic region: Secondary | ICD-10-CM | POA: Diagnosis not present

## 2022-12-16 DIAGNOSIS — M9904 Segmental and somatic dysfunction of sacral region: Secondary | ICD-10-CM

## 2022-12-16 DIAGNOSIS — M9903 Segmental and somatic dysfunction of lumbar region: Secondary | ICD-10-CM | POA: Diagnosis not present

## 2022-12-16 DIAGNOSIS — M9901 Segmental and somatic dysfunction of cervical region: Secondary | ICD-10-CM | POA: Diagnosis not present

## 2022-12-16 NOTE — Patient Instructions (Signed)
Great to see you Medial branch block ordered for Dr Nelva Bush left L4 Use the compression tool See me again in 8 weeks

## 2022-12-16 NOTE — Assessment & Plan Note (Signed)
Chronic problem with some increasing tightness.  Likely some of it seems to be more secondary to posture and ergonomics, discussed icing regimen and home exercises, discussed which activities to do and which ones to avoid.  Increase activity slowly.  Follow-up again in 8-10 weeks

## 2022-12-16 NOTE — Assessment & Plan Note (Signed)
Degenerative disease noted.  The patient did have a very relatively recent MRI showing nerve impingement.  Seems to be more consistent with patient's symptoms with the L4 on the left side.  Would like to see if patient would respond well to medial branch block and exam see if she is a candidate to radiofrequency ablation.  Patient will be referred to Dr. Nelva Bush for his expertise

## 2022-12-16 NOTE — Addendum Note (Signed)
Addended by: Pollyann Glen on: 12/16/2022 01:34 PM   Modules accepted: Orders

## 2022-12-30 DIAGNOSIS — M546 Pain in thoracic spine: Secondary | ICD-10-CM | POA: Diagnosis not present

## 2023-01-13 ENCOUNTER — Ambulatory Visit: Payer: BC Managed Care – PPO | Admitting: Neurology

## 2023-01-18 ENCOUNTER — Other Ambulatory Visit: Payer: Self-pay | Admitting: Physical Medicine and Rehabilitation

## 2023-01-18 DIAGNOSIS — M5459 Other low back pain: Secondary | ICD-10-CM

## 2023-01-18 DIAGNOSIS — M546 Pain in thoracic spine: Secondary | ICD-10-CM

## 2023-02-01 ENCOUNTER — Ambulatory Visit
Admission: RE | Admit: 2023-02-01 | Discharge: 2023-02-01 | Disposition: A | Discharge status: Home / Self Care | Payer: Worker's Compensation | Source: Ambulatory Visit | Attending: Physical Medicine and Rehabilitation | Admitting: Physical Medicine and Rehabilitation

## 2023-02-01 DIAGNOSIS — M546 Pain in thoracic spine: Secondary | ICD-10-CM

## 2023-02-01 DIAGNOSIS — M5459 Other low back pain: Secondary | ICD-10-CM

## 2023-02-02 DIAGNOSIS — M5134 Other intervertebral disc degeneration, thoracic region: Secondary | ICD-10-CM | POA: Diagnosis not present

## 2023-02-02 DIAGNOSIS — M5136 Other intervertebral disc degeneration, lumbar region: Secondary | ICD-10-CM | POA: Diagnosis not present

## 2023-02-02 DIAGNOSIS — M47816 Spondylosis without myelopathy or radiculopathy, lumbar region: Secondary | ICD-10-CM | POA: Diagnosis not present

## 2023-02-09 NOTE — Progress Notes (Unsigned)
[name] [address] [place] [address] Phone: [phone] Subjective:   Stephanie Hall, am serving as a scribe for Dr. Antoine Primas.  I'm seeing this patient by the request  of:  Adrian Prince, MD  CC: back and neck pain   BJY:[id number]  Stephanie Hall is a 62 y.o. female coming in with complaint of back and neck pain. OMT on 12/16/2022. Patient states pulled her back the day after her last appointment. Neck is tight.  Significant on the right side.  Medications patient has been prescribed:   Taking:         Reviewed prior external information including notes and imaging from previsou exam, outside providers and external EMR if available.   As well as notes that were available from care everywhere and other healthcare systems.  Past medical history, social, surgical and family history all reviewed in electronic medical record.  No pertanent information unless stated regarding to the chief complaint.   Past Medical History:  Diagnosis Date   Hypertension     Allergies  Allergen Reactions   Neosporin [Bacitracin-Polymyxin B] Hives and Rash   Adhesive  [Tape] Rash   Contrast Media  [Iodinated Contrast Media] Itching, Dermatitis and Rash     Review of Systems:  No headache, visual changes, nausea, vomiting, diarrhea, constipation, dizziness, abdominal pain, skin rash, fevers, chills, night sweats, weight loss, swollen lymph nodes, body aches, joint swelling, chest pain, shortness of breath, mood changes. POSITIVE muscle aches  Objective  Blood pressure (!) 122/90, pulse 68, height 5\' 4"  (1.626 m), weight 174 lb (78.9 kg), SpO2 95 %.   General: No apparent distress alert and oriented x3 mood and affect normal, dressed appropriately.  HEENT: Pupils equal, extraocular movements intact  Respiratory: Patient's speak in full sentences and does not appear short of breath  Cardiovascular: No lower extremity edema, non tender, no  erythema  Gait normal Patient though does have significant tightness noted on the right side of the neck at the moment.  Multiple trigger points noted in the trapezius, rhomboid and left scapular musculature on the left side.  Does have limited sidebending and rotation of the neck noted.  Osteopathic findings  C2 flexed rotated and side bent right C3 flexed rotated and side bent right T3 extended rotated and side bent right inhaled rib T9 extended rotated and side bent right L2 flexed rotated and side bent right Sacrum right on right  After verbal consent patient was prepped with alcohol swab and with a 25-gauge half inch needle injected into the rhomboid, trapezius, and levator scapula with a total of 3 cc of 0.5% Marcaine and 1 cc of Kenalog 40 mg/mL minimal blood loss.  Band-Aid placed.  Postinjection instructions given   Assessment and Plan:  DJD (degenerative joint disease) of cervical spine Significant arthritis noted.  Discussed HEP  Discussed Which activity to do and which ones to avoid  Attempted trigger point injections as well and hopeful that this will make an improvement on the right side of the neck today.  Discussed which activities to do and which ones to avoid.  Seeing another provider for Workmen's Comp.  As well follow-up with me again 6 to 8 weeks    Nonallopathic problems  Decision today to treat with OMT was based on Physical Exam  After verbal consent patient was treated with HVLA, ME, FPR techniques in cervical, rib, thoracic, lumbar, and sacral  areas  Patient tolerated the procedure well with  improvement in symptoms  Patient given exercises, stretches and lifestyle modifications  See medications in patient instructions if given  Patient will follow up in 4-8 weeks    The above documentation has been reviewed and is accurate and complete Judi Saa, DO          Note: This dictation was prepared with Dragon dictation along with smaller  phrase technology. Any transcriptional errors that result from this process are unintentional.

## 2023-02-10 ENCOUNTER — Ambulatory Visit (INDEPENDENT_AMBULATORY_CARE_PROVIDER_SITE_OTHER): Payer: BC Managed Care – PPO | Admitting: Family Medicine

## 2023-02-10 VITALS — BP 122/90 | HR 68 | Ht 64.0 in | Wt 174.0 lb

## 2023-02-10 DIAGNOSIS — M9901 Segmental and somatic dysfunction of cervical region: Secondary | ICD-10-CM

## 2023-02-10 DIAGNOSIS — M9903 Segmental and somatic dysfunction of lumbar region: Secondary | ICD-10-CM | POA: Diagnosis not present

## 2023-02-10 DIAGNOSIS — M9904 Segmental and somatic dysfunction of sacral region: Secondary | ICD-10-CM

## 2023-02-10 DIAGNOSIS — M9908 Segmental and somatic dysfunction of rib cage: Secondary | ICD-10-CM

## 2023-02-10 DIAGNOSIS — M9902 Segmental and somatic dysfunction of thoracic region: Secondary | ICD-10-CM

## 2023-02-10 DIAGNOSIS — M25511 Pain in right shoulder: Secondary | ICD-10-CM | POA: Diagnosis not present

## 2023-02-10 DIAGNOSIS — M47812 Spondylosis without myelopathy or radiculopathy, cervical region: Secondary | ICD-10-CM | POA: Diagnosis not present

## 2023-02-10 MED ORDER — GABAPENTIN 300 MG PO CAPS: 300.0000 mg | ORAL_CAPSULE | Freq: Every day | ORAL | 0 refills | Status: DC

## 2023-02-10 NOTE — Assessment & Plan Note (Signed)
Significant arthritis noted.  Discussed HEP  Discussed Which activity to do and which ones to avoid  Attempted trigger point injections as well and hopeful that this will make an improvement on the right side of the neck today.  Discussed which activities to do and which ones to avoid.  Seeing another provider for Workmen's Comp.  As well follow-up with me again 6 to 8 weeks

## 2023-02-10 NOTE — Assessment & Plan Note (Signed)
Patient given injection and tolerated the procedure well.  Patient did have some improvement in range of motion almost immediately.  Hopeful that this will make a difference.  Follow-up again in 6 to 8 weeks otherwise.

## 2023-02-10 NOTE — Patient Instructions (Addendum)
Trigger point injections today Gabapentin increase to 300mg   See you again in 6-8 weeks

## 2023-02-17 NOTE — Progress Notes (Unsigned)
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  Unclear ir residual symptoms from previous vestibular neuritis or migrainous.   Pain management of cervical spondylosis/cervical radiculopathy *** Headache rescue:  rizatriptan, methocarbamol, acetaminophen Limit use of pain relievers to no more than 2 days out of week to prevent risk of rebound or medication-overuse headache. Keep headache diary Follow up 1 year     Subjective:  Stephanie Hall is a 62 year old right-handed white female with hypertension who follows up for vertigo and migraine UPDATE: Current medications:  rizatriptan 10mg , methocarbamol, acetaminophen, duloxetine 30mg  daily     She has been seeing Dr. Katrinka Blazing of Sports Medicine for her neck pain.  Has been treated with OMT, trigger point injections ***.  She also has been treated by Dr. Ethelene Hal of pain management.  MRI of cervical spine revealed degenerative spine disease contributing to mild spinal stenosis and moderate left/severe right C6 neural foraminal stenosis at C5-6, moderate-severe right C4 neural foraminal stenosis.   HISTORY: She works as an Charity fundraiser at [employer].  On 10/12/2019, she was at work walking down the hall when she suddenly felt dizzy.  She noted spinning vertigo and headache.  She went home but the vertigo became worse and she had nausea and vomiting.  She returned to the ED for evaluation.  She was given meclizine and Zofran which were ineffective.  Initial head CT was normal.  She was transported to [facility] for an [facility].  MRI brain with and without contrast were normal. Labs and EKG revealed no cardiac abnormality, anemia or electrolyte imbalance.  She was diagnosed with peripheral vertigo.  She felt  wobbly for the next 3 days.  Then on 10/21/2019, she woke up with severe pounding and squeezing bilateral retro-orbital headache as well as nausea and feeling unsteady but no vertigo.  Symptoms subsided.  She had a recurrence on 10/25/2019 and symptoms have persisted.  She reports that her eyes feel like they are floating in syrup.  She reports some blurred vision, particularly with distance and her reading glasses don't work but she denies double vision and visual obscurations.  She has tinnitus but denies pulsatile tinnitus.  She reports right sided aural fullness.  Dizziness is aggravated by riding in a car.  She feels unsteady on her feet.  She finds it a little difficult to speak, like her tongue is swollen, as well as some word-finding difficulty.  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  She was placed on acetazolamide 500mg  twice daily and was started on a course of prednisone and has Valium on-hand.  I spoke with patient's ophthalmologist, Dr. Allyne Gee, who in retrospect wasn't convinced that she definitely had papilledema on exam.  I still wanted to proceed with workup with MRA/MRV of head and lumbar puncture.  However, thinking that her vertigo may be migraine, I wanted her to switch from acetazolamide to topiramate 25mg  twice daily.  MRA of head on 11/20/2019 showed possible moderate proximal left M2 stenosis but. follow up CTA of head on 11/30/2019 confirmed it was artifact.  MRV of head on 11/23/2019 showed no stenosis or thrombosis.  Lumbar punctre on 11/24/2019 demonstrated normal opening pressure of 16 cm water.   She reports history of headaches all of her life,  which sound like migraines.  They are normally severe pressure-like pain in the frontal region or posterior neck and occipital regions.  They are associated with nausea and photophobia.  She also has history of ocular migraines presenting as scintillating scotoma lasting 20  minutes.  Every couple of months, she gets a left facial neuralgia - sharp twinge in the temple and a mild burning discomfort in the V1-V2 distribution.  May last a day.  It comes and goes over the past 10 years.  Saw neurology at that time.  Sed rate was unremarkable.  More frequent since the vestibular episode.     Past antiepileptic:  topiramate (cognitive deficits) Past CGRP inhibitor:  Aimovig 140mg   PAST MEDICAL HISTORY: Past Medical History:  Diagnosis Date   Hypertension     MEDICATIONS: Current Outpatient Medications on File Prior to Visit  Medication Sig Dispense Refill   AIMOVIG 140 MG/ML SOAJ INJECT 140 MG INTO THE SKIN EVERY 28 (TWENTY-EIGHT) DAYS. 3 mL 0   ALPRAZolam (XANAX) 0.5 MG tablet Take 0.5 mg by mouth as needed.     b complex vitamins tablet Take 1 tablet by mouth daily.     Diclofenac-miSOPROStol 75-0.2 MG TBEC diclofenac 75 mg-misoprostol 200 mcg tablet,immediate,delayed release     diphenhydrAMINE (BENADRYL) 50 MG tablet Take 1 tablet (50 mg total) by mouth once for 1 dose. 1 hour prior to test 1 tablet 0   DULoxetine (CYMBALTA) 30 MG capsule Take 30 mg by mouth daily.     hydrochlorothiazide (HYDRODIURIL) 25 MG tablet Take 25 mg by mouth daily.     linaclotide (LINZESS) 145 MCG CAPS capsule Take by mouth.     methocarbamol (ROBAXIN) 500 MG tablet As needed.     metoprolol succinate (TOPROL-XL) 50 MG 24 hr tablet Take 1 tablet by mouth 2 (two) times daily.     Mirabegron (MYRBETRIQ PO) Take 1 tablet by mouth daily at 12 noon.     omeprazole (PRILOSEC) 40 MG capsule Take 1 capsule by mouth daily.     OVER THE COUNTER MEDICATION Multivitamin-Take 1 table by mouth daily.     predniSONE (DELTASONE) 50 MG tablet Take 1 tablet by mouth 13 hrs prior to test, another tablet 7 hours prior, and last dose 1 hour prior. 3 tablet 0   rizatriptan (MAXALT-MLT) 10 MG disintegrating tablet Take 1 tablet earliest onset of migraine.  May repeat in 2 hours if needed. Maximum 2  tablets in 24 hours. 9 tablet 5   traZODone (DESYREL) 50 MG tablet trazodone 50 mg tablet     No current facility-administered medications on file prior to visit.    ALLERGIES: Allergies  Allergen Reactions   Adhesive  [Tape] Rash   Contrast Media  [Iodinated Contrast Media] Dermatitis, Itching and Rash   Neosporin [Bacitracin-Polymyxin B] Hives and Rash    FAMILY HISTORY: No family history on file.    Objective:  *** General: No acute distress.  Patient appears well-groomed.   Head:  Normocephalic/atraumatic Eyes:  Fundi examined but not visualized Neck: supple, no paraspinal tenderness, full range of motion Heart:  Regular rate and rhythm Neurological Exam: alert and oriented to person, place, and time.  Speech fluent and not dysarthric, language intact.  CN II-XII intact. Bulk and tone normal, muscle strength 5/5 throughout.  Sensation to light touch intact.  Deep tendon reflexes 2+ throughout.  Finger to nose testing intact.  Gait normal, Romberg negative.   Shon Millet, DO  CC: Adrian Prince, MD

## 2023-02-18 ENCOUNTER — Telehealth: Payer: Self-pay

## 2023-02-18 ENCOUNTER — Ambulatory Visit (INDEPENDENT_AMBULATORY_CARE_PROVIDER_SITE_OTHER): Payer: BC Managed Care – PPO | Admitting: Neurology

## 2023-02-18 ENCOUNTER — Encounter: Payer: Self-pay | Admitting: Neurology

## 2023-02-18 VITALS — BP 123/71 | HR 69 | Ht 64.0 in | Wt 171.0 lb

## 2023-02-18 DIAGNOSIS — G43709 Chronic migraine without aura, not intractable, without status migrainosus: Secondary | ICD-10-CM

## 2023-02-18 DIAGNOSIS — M47812 Spondylosis without myelopathy or radiculopathy, cervical region: Secondary | ICD-10-CM | POA: Diagnosis not present

## 2023-02-18 NOTE — Telephone Encounter (Signed)
Patient seen in office. Per Dr.Jaffe patient to start Botox 200 units.

## 2023-02-18 NOTE — Patient Instructions (Signed)
Plan to start BOTOX every 3 months Limit use of pain relievers to no more than 2 days out of week to prevent risk of rebound or medication-overuse headache. Keep headache diary

## 2023-02-22 DIAGNOSIS — I10 Essential (primary) hypertension: Secondary | ICD-10-CM | POA: Diagnosis not present

## 2023-02-22 DIAGNOSIS — R002 Palpitations: Secondary | ICD-10-CM | POA: Diagnosis not present

## 2023-02-22 DIAGNOSIS — E785 Hyperlipidemia, unspecified: Secondary | ICD-10-CM | POA: Diagnosis not present

## 2023-02-22 DIAGNOSIS — E559 Vitamin D deficiency, unspecified: Secondary | ICD-10-CM | POA: Diagnosis not present

## 2023-02-26 DIAGNOSIS — M47816 Spondylosis without myelopathy or radiculopathy, lumbar region: Secondary | ICD-10-CM | POA: Diagnosis not present

## 2023-03-04 ENCOUNTER — Other Ambulatory Visit (HOSPITAL_COMMUNITY): Payer: Self-pay

## 2023-03-04 ENCOUNTER — Telehealth: Payer: Self-pay | Admitting: Pharmacy Technician

## 2023-03-04 NOTE — Telephone Encounter (Signed)
Patient Advocate Encounter  Received notification from [employer] that prior authorization for BOTOX 200U is required.   PA submitted on 5.23.24 Key [id number] Status is pending

## 2023-03-09 NOTE — Telephone Encounter (Signed)
Additional information needed by the plan. Completed form and faxed back to the number on the form. FAX: [phone]  Also submitted Appeal via CMM, since it Denied.

## 2023-03-10 ENCOUNTER — Other Ambulatory Visit (HOSPITAL_COMMUNITY): Payer: Self-pay

## 2023-03-10 MED ORDER — ONABOTULINUMTOXINA 200 UNITS IJ SOLR: 200.0000 [IU] | INTRAMUSCULAR | 4 refills | Status: DC

## 2023-03-10 NOTE — Telephone Encounter (Signed)
F/u   Rtn call back to the CMA

## 2023-03-10 NOTE — Telephone Encounter (Signed)
Pharmacy Patient Advocate Encounter- Botox BIV-Medical Benefit:  PA was submitted to [employer] and has been approved through: 5.23.24 - 8.29.24 Authorization#  [id number] APPEAL CASE: [id number]   Please send prescription to Specialty Pharmacy: [facility]: [phone]  Estimated Patient cost is: UNSURE

## 2023-03-10 NOTE — Telephone Encounter (Signed)
Got patient sch for 04-02-23

## 2023-03-10 NOTE — Telephone Encounter (Signed)
LMOVM for patient Botox approved please give [employer] a call to give consent to have it delivered to the office. And give [place] a call to schedule for 6/14 or 6/21 botox days.

## 2023-03-16 ENCOUNTER — Other Ambulatory Visit (HOSPITAL_COMMUNITY): Payer: Self-pay

## 2023-03-16 NOTE — Telephone Encounter (Signed)
Please see updated Botox Copay Card below.

## 2023-03-23 NOTE — Progress Notes (Unsigned)
[name] [address] [place] [address] Phone: [phone] Subjective:   Stephanie Hall, am serving as a scribe for Dr. Antoine Primas.  I'm seeing this patient by the request  of:  Adrian Prince, MD  CC: Neck pain follow-up  BJY:NWGNFAOZHY  Davisha Linthicum is a 62 y.o. female coming in with complaint of back and neck pain. OMT on 02/10/2023. Patient states that her neck is still bothering her. Approve for Botox for her migraines. Will start botox injections with Dr. Everlena Cooper late June. Epidural May 17th. Minimal relief post-injection. Also seeing PT 2x a week.   Medications patient has been prescribed: Gabapentin  Taking:         Reviewed prior external information including notes and imaging from previsou exam, outside providers and external EMR if available.   As well as notes that were available from care everywhere and other healthcare systems.  Past medical history, social, surgical and family history all reviewed in electronic medical record.  No pertanent information unless stated regarding to the chief complaint.   Past Medical History:  Diagnosis Date   Hypertension     Allergies  Allergen Reactions   Neosporin [Bacitracin-Polymyxin B] Hives and Rash   Adhesive  [Tape] Rash   Contrast Media  [Iodinated Contrast Media] Itching, Dermatitis and Rash     Review of Systems:  No  visual changes, nausea, vomiting, diarrhea, constipation, dizziness, abdominal pain, skin rash, fevers, chills, night sweats, weight loss, swollen lymph nodes, body aches, joint swelling, chest pain, shortness of breath, mood changes. POSITIVE muscle aches, headache  Objective  Blood pressure 110/82, pulse 67, height 5\' 4"  (1.626 m), weight 165 lb (74.8 kg), SpO2 97 %.   General: No apparent distress alert and oriented x3 mood and affect normal, dressed appropriately.  HEENT: Pupils equal, extraocular movements intact  Respiratory: Patient's speak in  full sentences and does not appear short of breath  Cardiovascular: No lower extremity edema, non tender, no erythema    Patient with neck exam does have loss of lordosis and limited side bending tightness mostly in the occipital region.  Osteopathic findings C2 flexed rotated and side bent left C3 flexed rotated and side bent right C5 flexed rotated and side bent left T3 extended rotated and side bent right inhaled rib T9 extended rotated and side bent left L2 flexed rotated and side bent right Sacrum right on right       Assessment and Plan:  DJD (degenerative joint disease) of cervical spine Known arthritic changes.  Is having more musculature in nature.  Patient did not respond as well to the epidural and hopefully that the Botox that she will be having by neurology will be beneficial.  Discussed continuing to work on posture and ergonomics, discussed the home exercises.  Follow-up again in 6 to 8 weeks.    Nonallopathic problems  Decision today to treat with OMT was based on Physical Exam  After verbal consent patient was treated with HVLA, ME, FPR techniques in cervical, rib, thoracic, lumbar, and sacral  areas  Patient tolerated the procedure well with improvement in symptoms  Patient given exercises, stretches and lifestyle modifications  See medications in patient instructions if given  Patient will follow up in 4-8 weeks     The above documentation has been reviewed and is accurate and complete Judi Saa, DO         Note: This dictation was prepared with Dragon dictation along with smaller  Company secretary. Any transcriptional errors that result from this process are unintentional.

## 2023-03-24 ENCOUNTER — Encounter: Payer: Self-pay | Admitting: Family Medicine

## 2023-03-24 ENCOUNTER — Ambulatory Visit (INDEPENDENT_AMBULATORY_CARE_PROVIDER_SITE_OTHER): Payer: BC Managed Care – PPO | Admitting: Family Medicine

## 2023-03-24 VITALS — BP 110/82 | HR 67 | Ht 64.0 in | Wt 165.0 lb

## 2023-03-24 DIAGNOSIS — M9901 Segmental and somatic dysfunction of cervical region: Secondary | ICD-10-CM | POA: Diagnosis not present

## 2023-03-24 DIAGNOSIS — M9904 Segmental and somatic dysfunction of sacral region: Secondary | ICD-10-CM

## 2023-03-24 DIAGNOSIS — M9908 Segmental and somatic dysfunction of rib cage: Secondary | ICD-10-CM

## 2023-03-24 DIAGNOSIS — M9902 Segmental and somatic dysfunction of thoracic region: Secondary | ICD-10-CM

## 2023-03-24 DIAGNOSIS — M47812 Spondylosis without myelopathy or radiculopathy, cervical region: Secondary | ICD-10-CM

## 2023-03-24 DIAGNOSIS — M9903 Segmental and somatic dysfunction of lumbar region: Secondary | ICD-10-CM | POA: Diagnosis not present

## 2023-03-24 NOTE — Patient Instructions (Signed)
Great to see you See me again in 8 weeks Excited to see how Botox goes

## 2023-03-24 NOTE — Assessment & Plan Note (Signed)
Known arthritic changes.  Is having more musculature in nature.  Patient did not respond as well to the epidural and hopefully that the Botox that she will be having by neurology will be beneficial.  Discussed continuing to work on posture and ergonomics, discussed the home exercises.  Follow-up again in 6 to 8 weeks.

## 2023-04-02 ENCOUNTER — Ambulatory Visit (INDEPENDENT_AMBULATORY_CARE_PROVIDER_SITE_OTHER): Payer: BC Managed Care – PPO | Admitting: Neurology

## 2023-04-02 DIAGNOSIS — G43709 Chronic migraine without aura, not intractable, without status migrainosus: Secondary | ICD-10-CM | POA: Diagnosis not present

## 2023-04-02 MED ORDER — ONABOTULINUMTOXINA 100 UNITS IJ SOLR
200.0000 [IU] | Freq: Once | INTRAMUSCULAR | Status: AC
Start: 2023-04-02 — End: 2023-04-02
  Administered 2023-04-02: 155 [IU] via INTRAMUSCULAR

## 2023-04-02 NOTE — Progress Notes (Signed)
[facility]  ° °Procedure Note Botox ° °Attending: Dr. Amalie Koran ° °Preoperative Diagnosis(es): Chronic migraine ° °Consent obtained from: The patient °Benefits discussed included, but were not limited to decreased muscle tightness, increased joint range of motion, and decreased pain.  Risk discussed included, but were not limited pain and discomfort, bleeding, bruising, excessive weakness, venous thrombosis, muscle atrophy and dysphagia.  Anticipated outcomes of the procedure as well as he risks and benefits of the alternatives to the procedure, and the roles and tasks of the personnel to be involved, were discussed with the patient, and the patient consents to the procedure and agrees to proceed. A copy of the patient medication guide was given to the patient which explains the blackbox warning. ° °Patients identity and treatment sites confirmed Yes.  . ° °Details of Procedure: °Skin was cleaned with alcohol. Prior to injection, the needle plunger was aspirated to make sure the needle was not within a blood vessel.  There was no blood retrieved on aspiration.   ° °Following is a summary of the muscles injected  And the amount of Botulinum toxin used: ° °Dilution °200 units of Botox was reconstituted with 4 ml of preservative free normal saline. °Time of reconstitution: At the time of the office visit (<30 minutes prior to injection)  ° °Injections  °155 total units of Botox was injected with a 30 gauge needle. ° °Injection Sites: °L occipitalis: 15 units- 3 sites  °R occiptalis: 15 units- 3 sites ° °L upper trapezius: 15 units- 3 sites °R upper trapezius: 15 units- 3 sits          °L paraspinal: 10 units- 2 sites °R paraspinal: 10 units- 2 sites ° °Face °L frontalis(2 injection sites):10 units   °R frontalis(2 injection sites):10 units         °L corrugator: 5 units   °R corrugator: 5 units           °Procerus: 5 units   °L temporalis: 20 units °R temporalis: 20 units  ° °Agent:  °200 units of botulinum Type  A (Onobotulinum Toxin type A) was reconstituted with 4 ml of preservative free normal saline.  °Time of reconstitution: At the time of the office visit (<30 minutes prior to injection)  ° ° ° Total injected (Units):  155 ° Total wasted (Units):  45 ° °Patient tolerated procedure well without complications.   °Reinjection is anticipated in 3 months. ° ° °

## 2023-04-11 ENCOUNTER — Other Ambulatory Visit: Payer: Self-pay | Admitting: Neurology

## 2023-05-04 ENCOUNTER — Other Ambulatory Visit: Payer: Self-pay | Admitting: Family Medicine

## 2023-05-18 NOTE — Progress Notes (Unsigned)
[name] [address] [place] [address] Phone: [phone] Subjective:   Bruce Donath, am serving as a scribe for Dr. Antoine Primas.  I'm seeing this patient by the request  of:  Adrian Prince, MD  CC: Multiple joint plaints  [id number]  Stephanie Hall is a 62 y.o. female coming in with complaint of back and neck pain. OMT on 03/24/2023. Patient states that she is having R hip pain in posterior aspect that wraps around to her quad. Standing makes her pain worse as well as lumbar extension and flexion. Dr. Ethelene Hal did epidural 2 weeks ago but she had not noticed any relief.   Also having neck stiffness. Same as last visit.    Getting CMC joints injected this afternoon by Dr. Amanda Pea.  Medications patient has been prescribed: Gabapentin  Taking:         Reviewed prior external information including notes and imaging from previsou exam, outside providers and external EMR if available.   As well as notes that were available from care everywhere and other healthcare systems.  Past medical history, social, surgical and family history all reviewed in electronic medical record.  No pertanent information unless stated regarding to the chief complaint.   Past Medical History:  Diagnosis Date   Hypertension     Allergies  Allergen Reactions   Neosporin [Bacitracin-Polymyxin B] Hives and Rash   Adhesive  [Tape] Rash   Contrast Media  [Iodinated Contrast Media] Itching, Dermatitis and Rash     Review of Systems:  No headache, visual changes, nausea, vomiting, diarrhea, constipation, dizziness, abdominal pain, skin rash, fevers, chills, night sweats, weight loss, swollen lymph nodes, body aches, joint swelling, chest pain, shortness of breath, mood changes. POSITIVE muscle aches  Objective  Blood pressure (!) 122/90, height 5\' 4"  (1.626 m), weight 171 lb (77.6 kg).   General: No apparent distress alert and oriented x3 mood and  affect normal, dressed appropriately.  HEENT: Pupils equal, extraocular movements intact  Respiratory: Patient's speak in full sentences and does not appear short of breath  Cardiovascular: No lower extremity edema, non tender, no erythema  Right hand exam shows the patient does have atrophy of the [facility] joint noted.  Positive grind test noted. Left hand shows replacement of the [facility] joint.  Positive Tinel's noted.  Some atrophy noted of the thenar eminence. Low back does have loss of lordosis.  Only 5 degrees of extension noted.  Osteopathic findings  C3 flexed rotated and side bent right C7 flexed rotated and side bent left T3 extended rotated and side bent right inhaled rib T9 extended rotated and side bent left L2 flexed rotated and side bent right Sacrum right on right  Procedure: Real-time Ultrasound Guided Injection of right CMC joint Device: GE Logiq Q7 Ultrasound guided injection is preferred based studies that show increased duration, increased effect, greater accuracy, decreased procedural pain, increased response rate, and decreased cost with ultrasound guided versus blind injection.  Verbal informed consent obtained.  Time-out conducted.  Noted no overlying erythema, induration, or other signs of local infection.  Skin prepped in a sterile fashion.  Local anesthesia: Topical Ethyl chloride.  With sterile technique and under real time ultrasound guidance: With a 25-gauge half inch needle injected with 0.5 cc of 0.5% Marcaine and 0.5 cc of Kenalog 40 mg/mL Completed without difficulty  Pain immediately improved suggesting accurate placement of the medication.  Advised to call if fevers/chills, erythema, induration, drainage, or persistent bleeding.  Impression: Technically successful ultrasound guided injection.    Procedure: Real-time Ultrasound Guided Injection of left carpal tunnel Device: GE Logiq Q7 Ultrasound guided injection is preferred based studies that show  increased duration, increased effect, greater accuracy, decreased procedural pain, increased response rate with ultrasound guided versus blind injection.  Verbal informed consent obtained.  Time-out conducted.  Noted no overlying erythema, induration, or other signs of local infection.  Skin prepped in a sterile fashion.  Local anesthesia: Topical Ethyl chloride.  With sterile technique and under real time ultrasound guidance:  median nerve visualized.  23g 5/8 inch needle inserted distal to proximal approach into nerve sheath. Pictures taken nfor needle placement. Patient did have injection of 0.5 cc of 0.5% Marcaine, and 1 cc of Kenalog 40 mg/dL. Completed without difficulty  Pain immediately resolved suggesting accurate placement of the medication.  Advised to call if fevers/chills, erythema, induration, drainage, or persistent bleeding.  Images permanently stored and available for review in the ultrasound unit.  Impression: Technically successful ultrasound guided injection.     Assessment and Plan:  Arthritis of carpometacarpal [facility]) joint of right thumb Patient has had injections from an outside facility previously.  We discussed icing regimen and home exercises, discussed which activities to do and which ones to avoid.  Increase activity slowly.  Follow-up with me again to 8 weeks.  Left carpal tunnel syndrome Patient given injection and tolerated the procedure well, discussed icing regimen and home exercises, discussed with patient about potential bracing at night.  Hopeful that this will make significant improvement.  Follow-up again in 8 weeks  DDD (degenerative disc disease), lumbar Significant facet arthropathy.  No longer responding well to facet or epidural injections.  We could consider the possibility of ablation but patient has tried that in the past.  Has tried multiple medications including Cymbalta.  We discussed with patient about different treatment options.  Patient  would like to try to go up slowly on the gabapentin.  We will go up to 100 mg twice daily and then 300 mg at night.  Will see how patient responds.     Nonallopathic problems  Decision today to treat with OMT was based on Physical Exam  After verbal consent patient was treated with HVLA, ME, FPR techniques in cervical, rib, thoracic, lumbar, and sacral  areas  Patient tolerated the procedure well with improvement in symptoms  Patient given exercises, stretches and lifestyle modifications  See medications in patient instructions if given  Patient will follow up in 4-8 weeks    The above documentation has been reviewed and is accurate and complete Judi Saa, DO          Note: This dictation was prepared with Dragon dictation along with smaller phrase technology. Any transcriptional errors that result from this process are unintentional.

## 2023-05-20 ENCOUNTER — Ambulatory Visit (INDEPENDENT_AMBULATORY_CARE_PROVIDER_SITE_OTHER): Payer: BC Managed Care – PPO | Admitting: Family Medicine

## 2023-05-20 ENCOUNTER — Other Ambulatory Visit: Payer: Self-pay

## 2023-05-20 ENCOUNTER — Encounter: Payer: Self-pay | Admitting: Family Medicine

## 2023-05-20 VITALS — BP 122/90 | Ht 64.0 in | Wt 171.0 lb

## 2023-05-20 DIAGNOSIS — M5136 Other intervertebral disc degeneration, lumbar region: Secondary | ICD-10-CM

## 2023-05-20 DIAGNOSIS — M1811 Unilateral primary osteoarthritis of first carpometacarpal joint, right hand: Secondary | ICD-10-CM | POA: Diagnosis not present

## 2023-05-20 DIAGNOSIS — G5602 Carpal tunnel syndrome, left upper limb: Secondary | ICD-10-CM | POA: Diagnosis not present

## 2023-05-20 DIAGNOSIS — M9903 Segmental and somatic dysfunction of lumbar region: Secondary | ICD-10-CM

## 2023-05-20 DIAGNOSIS — M9901 Segmental and somatic dysfunction of cervical region: Secondary | ICD-10-CM

## 2023-05-20 DIAGNOSIS — M9904 Segmental and somatic dysfunction of sacral region: Secondary | ICD-10-CM | POA: Diagnosis not present

## 2023-05-20 DIAGNOSIS — M79644 Pain in right finger(s): Secondary | ICD-10-CM

## 2023-05-20 DIAGNOSIS — M9908 Segmental and somatic dysfunction of rib cage: Secondary | ICD-10-CM

## 2023-05-20 DIAGNOSIS — M9902 Segmental and somatic dysfunction of thoracic region: Secondary | ICD-10-CM | POA: Diagnosis not present

## 2023-05-20 MED ORDER — GABAPENTIN 100 MG PO CAPS: 200.0000 mg | ORAL_CAPSULE | Freq: Every day | ORAL | 0 refills | Status: DC

## 2023-05-20 NOTE — Assessment & Plan Note (Signed)
Patient has had injections from an outside facility previously.  We discussed icing regimen and home exercises, discussed which activities to do and which ones to avoid.  Increase activity slowly.  Follow-up with me again to 8 weeks.

## 2023-05-20 NOTE — Assessment & Plan Note (Signed)
Patient given injection and tolerated the procedure well, discussed icing regimen and home exercises, discussed with patient about potential bracing at night.  Hopeful that this will make significant improvement.  Follow-up again in 8 weeks

## 2023-05-20 NOTE — Patient Instructions (Addendum)
R thumb and L wrist injection today Gabapentin 100mg  am and pm and 300mg  at night See me again in 2 months

## 2023-05-20 NOTE — Assessment & Plan Note (Signed)
Significant facet arthropathy.  No longer responding well to facet or epidural injections.  We could consider the possibility of ablation but patient has tried that in the past.  Has tried multiple medications including Cymbalta.  We discussed with patient about different treatment options.  Patient would like to try to go up slowly on the gabapentin.  We will go up to 100 mg twice daily and then 300 mg at night.  Will see how patient responds.

## 2023-06-21 ENCOUNTER — Telehealth: Payer: Self-pay

## 2023-06-21 NOTE — Telephone Encounter (Signed)
PA needed for Botox 

## 2023-06-28 ENCOUNTER — Telehealth: Payer: Self-pay | Admitting: Pharmacy Technician

## 2023-06-28 ENCOUNTER — Other Ambulatory Visit (HOSPITAL_COMMUNITY): Payer: Self-pay

## 2023-06-28 ENCOUNTER — Telehealth: Payer: Self-pay

## 2023-06-28 ENCOUNTER — Encounter: Payer: Self-pay | Admitting: Neurology

## 2023-06-28 DIAGNOSIS — Z1231 Encounter for screening mammogram for malignant neoplasm of breast: Secondary | ICD-10-CM | POA: Diagnosis not present

## 2023-06-28 DIAGNOSIS — Z6830 Body mass index (BMI) 30.0-30.9, adult: Secondary | ICD-10-CM | POA: Diagnosis not present

## 2023-06-28 DIAGNOSIS — Z13820 Encounter for screening for osteoporosis: Secondary | ICD-10-CM | POA: Diagnosis not present

## 2023-06-28 DIAGNOSIS — Z1272 Encounter for screening for malignant neoplasm of vagina: Secondary | ICD-10-CM | POA: Diagnosis not present

## 2023-06-28 DIAGNOSIS — Z01419 Encounter for gynecological examination (general) (routine) without abnormal findings: Secondary | ICD-10-CM | POA: Diagnosis not present

## 2023-06-28 NOTE — Telephone Encounter (Signed)
Pharmacy Patient Advocate Encounter   Received notification from CoverMyMeds that prior authorization for BOTOX 200U is required/requested.   Insurance verification completed.   The patient is insured through [facility] .   Per test claim: PA required; PA submitted to [facility] via CoverMyMeds Key/confirmation #/[id number] Status is pending

## 2023-06-28 NOTE — Telephone Encounter (Signed)
Pharmacy Patient Advocate Encounter  Received notification from [facility] that Prior Authorization for BOTOX 200 has been DENIED.  Full denial letter will be uploaded to the media tab. See denial reason below.    PA #/Case ID/Reference #: [id number]  Please be advised we currently do not have a Pharmacist to review denials, therefore you will need to process appeals accordingly as needed. Thanks for your support at this time. Contact for appeals are as follows: Phone: [phone], Fax:

## 2023-06-28 NOTE — Telephone Encounter (Signed)
Per Pharmacy, PA is needed for Botox before delivery can be sent

## 2023-06-29 NOTE — Telephone Encounter (Signed)
We tried calling the insurance for a peer to peer.  They stated that we must exhaust all appeals before a provider can talk with a physician.  Advised to send an emergent prior authorization with note attached stating unable to assess adequate response to Botox after just one round.     Urgent PA sent with the note. We should get a response in 24-48 hours.

## 2023-07-02 ENCOUNTER — Ambulatory Visit: Payer: BC Managed Care – PPO | Admitting: Neurology

## 2023-07-14 NOTE — Progress Notes (Unsigned)
[name] [address] [place] [address] Phone: [phone] Subjective:   INadine Counts, am serving as a scribe for Dr. Antoine Primas.  I'm seeing this patient by the request  of:  Adrian Prince, MD  CC: Multiple complaints  [id number]  Yaritzel Stange is a 62 y.o. female coming in with complaint of back and neck pain. OMT on 05/20/2023. Also seen for wrist and thumb pain. Patient states same per usual. Has some cyst on the L wrist she would like you to take a look at. No other concerns.         Reviewed prior external information including notes and imaging from previsou exam, outside providers and external EMR if available.   As well as notes that were available from care everywhere and other healthcare systems.  Past medical history, social, surgical and family history all reviewed in electronic medical record.  No pertanent information unless stated regarding to the chief complaint.   Past Medical History:  Diagnosis Date   Hypertension     Allergies  Allergen Reactions   Neosporin [Bacitracin-Polymyxin B] Hives and Rash   Adhesive  [Tape] Rash   Contrast Media  [Iodinated Contrast Media] Itching, Dermatitis and Rash     Review of Systems:  No headache, visual changes, nausea, vomiting, diarrhea, constipation, dizziness, abdominal pain, skin rash, fevers, chills, night sweats, weight loss, swollen lymph nodes, body aches, joint swelling, chest pain, shortness of breath, mood changes. POSITIVE muscle aches  Objective  Height 5\' 4"  (1.626 m).   General: No apparent distress alert and oriented x3 mood and affect normal, dressed appropriately.  HEENT: Pupils equal, extraocular movements intact  Respiratory: Patient's speak in full sentences and does not appear short of breath  Cardiovascular: No lower extremity edema, non tender, no erythema  Left wrist exam does have some tenderness noted proximal to the [facility] joint.  Does  seem to have a cyst noted. Neck exam does have significant loss of lordosis noted.  Significant difficulty with extension of the neck. Tightness of the lower back noted.  Seems to be worse in the thoracolumbar juncture.   Osteopathic findings  C3 flexed rotated and side bent right C7 flexed rotated and side bent left T3 extended rotated and side bent right inhaled rib T11 extended rotated and side bent left L1 flexed rotated and side bent right Sacrum right on right  Limited muscular skeletal ultrasound was performed and interpreted by Antoine Primas, M  MRI hypoechoic changes noted.  Seems to be in the flexor carpi tendon sheath.  Does not seem to have any communication with any of the vascularity in the area. Impression: Ganglion cyst in the tendon sheath of the left wrist.  Procedure: Real-time Ultrasound Guided Injection of left flexor tendon sheath ganglion Device: GE Logiq Q7 Ultrasound guided injection is preferred based studies that show increased duration, increased effect, greater accuracy, decreased procedural pain, increased response rate, and decreased cost with ultrasound guided versus blind injection.  Verbal informed consent obtained.  Time-out conducted.  Noted no overlying erythema, induration, or other signs of local infection.  Skin prepped in a sterile fashion.  Local anesthesia: Topical Ethyl chloride.  With sterile technique and under real time ultrasound guidance: With a 25-gauge half inch needle injected with 0.5 cc of 0.5% Marcaine and 0.5 cc of Kenalog 40 mg/mL. Completed without difficulty  Pain immediately resolved suggesting accurate placement of the medication.  Advised to call if fevers/chills, erythema, induration, drainage,  or persistent bleeding.  Impression: Technically successful ultrasound guided injection.     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  Discussed icing regimen.  This does have significant close relation to the radial arteries we will need to monitor that as well.  No sign of any vascular compromise at this time.  Follow-up with me again 6 to 8 weeks  DJD (degenerative joint disease) of cervical spine Degenerative disc of the cervical spine but also having his continued headaches.  Had responded well to the Botox but recently was denied.  Was making significant improvements with patient's quality of life, decrease frequency and severity of headaches and patient did not need the rescue medications as much.  Hopeful that we can get this started again and it sounds that is neurology is working on it.  Discussed which activities to do and which ones to avoid.  Follow-up again in 6 to 8 weeks    Nonallopathic problems  Decision today to treat with OMT was based on Physical Exam  After verbal consent patient was treated with HVLA, ME, FPR techniques in cervical, rib, thoracic, lumbar, and sacral  areas  Patient tolerated the procedure well with improvement in symptoms  Patient given exercises, stretches and lifestyle modifications  See medications in patient instructions if given  Patient will follow up in 4-8 weeks    The above documentation has been reviewed and is accurate and complete Judi Saa, DO          Note: This dictation was prepared with Dragon dictation along with smaller phrase technology. Any transcriptional errors that result from this process are unintentional.

## 2023-07-15 ENCOUNTER — Other Ambulatory Visit: Payer: Self-pay

## 2023-07-15 ENCOUNTER — Ambulatory Visit: Payer: BC Managed Care – PPO | Admitting: Family Medicine

## 2023-07-15 ENCOUNTER — Encounter: Payer: Self-pay | Admitting: Family Medicine

## 2023-07-15 VITALS — Ht 64.0 in

## 2023-07-15 DIAGNOSIS — M9908 Segmental and somatic dysfunction of rib cage: Secondary | ICD-10-CM | POA: Diagnosis not present

## 2023-07-15 DIAGNOSIS — M67432 Ganglion, left wrist: Secondary | ICD-10-CM

## 2023-07-15 DIAGNOSIS — M9904 Segmental and somatic dysfunction of sacral region: Secondary | ICD-10-CM

## 2023-07-15 DIAGNOSIS — M47812 Spondylosis without myelopathy or radiculopathy, cervical region: Secondary | ICD-10-CM | POA: Diagnosis not present

## 2023-07-15 DIAGNOSIS — M9902 Segmental and somatic dysfunction of thoracic region: Secondary | ICD-10-CM

## 2023-07-15 DIAGNOSIS — M9901 Segmental and somatic dysfunction of cervical region: Secondary | ICD-10-CM | POA: Diagnosis not present

## 2023-07-15 DIAGNOSIS — M9903 Segmental and somatic dysfunction of lumbar region: Secondary | ICD-10-CM

## 2023-07-15 DIAGNOSIS — M25532 Pain in left wrist: Secondary | ICD-10-CM

## 2023-07-15 NOTE — Assessment & Plan Note (Signed)
Degenerative disc of the cervical spine but also having his continued headaches.  Had responded well to the Botox but recently was denied.  Was making significant improvements with patient's quality of life, decrease frequency and severity of headaches and patient did not need the rescue medications as much.  Hopeful that we can get this started again and it sounds that is neurology is working on it.  Discussed which activities to do and which ones to avoid.  Follow-up again in 6 to 8 weeks

## 2023-07-15 NOTE — Assessment & Plan Note (Signed)
Patient Tolerated the procedure well, discussed icing regimen and home exercises, discussed avoiding certain activities.  Discussed icing regimen.  This does have significant close relation to the radial arteries we will need to monitor that as well.  No sign of any vascular compromise at this time.  Follow-up with me again 6 to 8 weeks

## 2023-07-15 NOTE — Telephone Encounter (Signed)
Spoke to [employer] at [place], Botox PA approved for three months. Not allowed to give extra time.  [id number].    Delivery set for 10/8, spoke to Rep  [place].

## 2023-07-15 NOTE — Patient Instructions (Addendum)
Injected L wrist today See me again in 2 months

## 2023-07-23 ENCOUNTER — Ambulatory Visit: Payer: BC Managed Care – PPO | Admitting: Neurology

## 2023-07-23 DIAGNOSIS — G43709 Chronic migraine without aura, not intractable, without status migrainosus: Secondary | ICD-10-CM

## 2023-07-23 DIAGNOSIS — G43119 Migraine with aura, intractable, without status migrainosus: Secondary | ICD-10-CM | POA: Diagnosis not present

## 2023-07-23 DIAGNOSIS — H903 Sensorineural hearing loss, bilateral: Secondary | ICD-10-CM | POA: Diagnosis not present

## 2023-07-23 DIAGNOSIS — Z0110 Encounter for examination of ears and hearing without abnormal findings: Secondary | ICD-10-CM | POA: Diagnosis not present

## 2023-07-23 DIAGNOSIS — Z974 Presence of external hearing-aid: Secondary | ICD-10-CM | POA: Diagnosis not present

## 2023-07-23 DIAGNOSIS — H9313 Tinnitus, bilateral: Secondary | ICD-10-CM | POA: Diagnosis not present

## 2023-07-23 DIAGNOSIS — H9193 Unspecified hearing loss, bilateral: Secondary | ICD-10-CM | POA: Diagnosis not present

## 2023-07-23 MED ORDER — ONABOTULINUMTOXINA 100 UNITS IJ SOLR
200.0000 [IU] | Freq: Once | INTRAMUSCULAR | Status: AC
Start: 2023-07-23 — End: 2023-07-23
  Administered 2023-07-23: 155 [IU] via INTRAMUSCULAR

## 2023-07-23 NOTE — Progress Notes (Signed)

## 2023-08-11 DIAGNOSIS — L821 Other seborrheic keratosis: Secondary | ICD-10-CM | POA: Diagnosis not present

## 2023-08-11 DIAGNOSIS — D229 Melanocytic nevi, unspecified: Secondary | ICD-10-CM | POA: Diagnosis not present

## 2023-08-11 DIAGNOSIS — L820 Inflamed seborrheic keratosis: Secondary | ICD-10-CM | POA: Diagnosis not present

## 2023-08-11 DIAGNOSIS — D3617 Benign neoplasm of peripheral nerves and autonomic nervous system of trunk, unspecified: Secondary | ICD-10-CM | POA: Diagnosis not present

## 2023-08-11 DIAGNOSIS — D485 Neoplasm of uncertain behavior of skin: Secondary | ICD-10-CM | POA: Diagnosis not present

## 2023-08-19 ENCOUNTER — Encounter: Payer: Self-pay | Admitting: Neurology

## 2023-08-19 DIAGNOSIS — E785 Hyperlipidemia, unspecified: Secondary | ICD-10-CM | POA: Diagnosis not present

## 2023-08-19 DIAGNOSIS — E559 Vitamin D deficiency, unspecified: Secondary | ICD-10-CM | POA: Diagnosis not present

## 2023-08-19 DIAGNOSIS — I10 Essential (primary) hypertension: Secondary | ICD-10-CM | POA: Diagnosis not present

## 2023-08-23 DIAGNOSIS — I10 Essential (primary) hypertension: Secondary | ICD-10-CM | POA: Diagnosis not present

## 2023-08-23 DIAGNOSIS — R82998 Other abnormal findings in urine: Secondary | ICD-10-CM | POA: Diagnosis not present

## 2023-08-23 DIAGNOSIS — E785 Hyperlipidemia, unspecified: Secondary | ICD-10-CM | POA: Diagnosis not present

## 2023-08-23 DIAGNOSIS — Z0000 Encounter for general adult medical examination without abnormal findings: Secondary | ICD-10-CM | POA: Diagnosis not present

## 2023-08-31 DIAGNOSIS — I10 Essential (primary) hypertension: Secondary | ICD-10-CM | POA: Diagnosis not present

## 2023-08-31 DIAGNOSIS — I700 Atherosclerosis of aorta: Secondary | ICD-10-CM | POA: Diagnosis not present

## 2023-09-03 NOTE — Progress Notes (Signed)
[name] [address] [place] [address] Phone: [phone] Subjective:   Bruce Donath, am serving as a scribe for Dr. Antoine Primas.  I'm seeing this patient by the request  of:  Adrian Prince, MD  CC: Low back pain follow-up, shoulder pain follow-up  [id number]  Stephanie Hall is a 62 y.o. female coming in with complaint of back and neck pain. OMT on 07/15/2023. Also seen for L wrist pain. Patient states that is painful from C spine to L spine. Also c/o R shoulder pain.   Painful CMC jts. R>L. No strength in R hand.   Medications patient has been prescribed:   Taking:         Reviewed prior external information including notes and imaging from previsou exam, outside providers and external EMR if available.   As well as notes that were available from care everywhere and other healthcare systems.  Past medical history, social, surgical and family history all reviewed in electronic medical record.  No pertanent information unless stated regarding to the chief complaint.   Past Medical History:  Diagnosis Date   Hypertension     Allergies  Allergen Reactions   Neosporin [Bacitracin-Polymyxin B] Hives and Rash   Adhesive  [Tape] Rash   Contrast Media  [Iodinated Contrast Media] Itching, Dermatitis and Rash     Review of Systems:  No headache, visual changes, nausea, vomiting, diarrhea, constipation, dizziness, abdominal pain, skin rash, fevers, chills, night sweats, weight loss, swollen lymph nodes, body aches, joint swelling, chest pain, shortness of breath, mood changes. POSITIVE muscle aches  Objective  Blood pressure 110/78, pulse 68, height 5\' 4"  (1.626 m), weight 168 lb (76.2 kg), SpO2 95%.   General: No apparent distress alert and oriented x3 mood and affect normal, dressed appropriately.  HEENT: Pupils equal, extraocular movements intact  Respiratory: Patient's speak in full sentences and does not appear short  of breath  Cardiovascular: No lower extremity edema, non tender, no erythema  Significant discomfort over the right sacroiliac joint noted.  Patient does have tenderness to palpation over the right sacroiliac joint and difficulty with FABER test. Right thumb does have swelling noted.  Positive grind test noted.  No thenar eminence wasting noted.   Osteopathic findings  C2 flexed rotated and side bent right C6 flexed rotated and side bent left T3 extended rotated and side bent right inhaled rib T9 extended rotated and side bent left L2 flexed rotated and side bent right Sacrum right on right   After verbal consent patient was prepped with alcohol swab and with a 21-gauge 2 inch needle injected into the right sacroiliac joint with a total of 0.5 cc of 0.5% Marcaine and 1 cc of Kenalog 40 mg/mL.  No blood loss, Band-Aid placed.  Postinjection instructions given  Procedure: Real-time Ultrasound Guided Injection of right CMC joint Device: GE Logiq Q7 Ultrasound guided injection is preferred based studies that show increased duration, increased effect, greater accuracy, decreased procedural pain, increased response rate, and decreased cost with ultrasound guided versus blind injection.  Verbal informed consent obtained.  Time-out conducted.  Noted no overlying erythema, induration, or other signs of local infection.  Skin prepped in a sterile fashion.  Local anesthesia: Topical Ethyl chloride.  With sterile technique and under real time ultrasound guidance: With a 25-gauge half inch needle injected with 0.5 cc of 0.5% Marcaine and 0.5 cc of Kenalog 40 mg/mL Completed without difficulty  Pain immediately resolved suggesting accurate placement of  the medication.  Advised to call if fevers/chills, erythema, induration, drainage, or persistent bleeding.  Impression: Technically successful ultrasound guided injection.    Assessment and Plan:  Arthritis of carpometacarpal [facility]) joint of right  thumb Chronic problem with exacerbation.  Does have significant arthritic changes of multiple joints.  Patient is considering the possibility of retirement in the near future.  Discussed recommendation exercises, which activities to do and which ones to avoid.  Increase activity slowly otherwise.  Follow-up again in 6 to 8 weeks  DDD (degenerative disc disease), lumbar Degenerative disc disease noted at multiple levels.  Has responded well to osteopathic manipulation.  Given injection into the sacroiliac joint today.  Tolerated the procedure well.  Hopefully will make a difference.  Discussed icing regimen.  Follow-up again in 6 to 8 weeks otherwise.  SI (sacroiliac) joint dysfunction Patient given the right side injected and tolerated the procedure well, discussed icing regimen of home exercises, which activities to do and which ones to avoid.  Increase activity slowly otherwise.  Follow-up again in 6 to 8 weeks    Nonallopathic problems  Decision today to treat with OMT was based on Physical Exam  After verbal consent patient was treated with HVLA, ME, FPR techniques in cervical, rib, thoracic, lumbar, and sacral  areas  Patient tolerated the procedure well with improvement in symptoms  Patient given exercises, stretches and lifestyle modifications  See medications in patient instructions if given  Patient will follow up in 4-8 weeks     The above documentation has been reviewed and is accurate and complete Judi Saa, DO         Note: This dictation was prepared with Dragon dictation along with smaller phrase technology. Any transcriptional errors that result from this process are unintentional.

## 2023-09-14 ENCOUNTER — Encounter: Payer: Self-pay | Admitting: Family Medicine

## 2023-09-14 ENCOUNTER — Ambulatory Visit: Payer: BC Managed Care – PPO | Admitting: Family Medicine

## 2023-09-14 ENCOUNTER — Other Ambulatory Visit: Payer: Self-pay

## 2023-09-14 ENCOUNTER — Ambulatory Visit: Payer: BC Managed Care – PPO

## 2023-09-14 VITALS — BP 110/78 | HR 68 | Ht 64.0 in | Wt 168.0 lb

## 2023-09-14 DIAGNOSIS — M25532 Pain in left wrist: Secondary | ICD-10-CM

## 2023-09-14 DIAGNOSIS — M9908 Segmental and somatic dysfunction of rib cage: Secondary | ICD-10-CM | POA: Diagnosis not present

## 2023-09-14 DIAGNOSIS — M5412 Radiculopathy, cervical region: Secondary | ICD-10-CM

## 2023-09-14 DIAGNOSIS — M9903 Segmental and somatic dysfunction of lumbar region: Secondary | ICD-10-CM

## 2023-09-14 DIAGNOSIS — M51362 Other intervertebral disc degeneration, lumbar region with discogenic back pain and lower extremity pain: Secondary | ICD-10-CM | POA: Diagnosis not present

## 2023-09-14 DIAGNOSIS — M9904 Segmental and somatic dysfunction of sacral region: Secondary | ICD-10-CM | POA: Diagnosis not present

## 2023-09-14 DIAGNOSIS — M25531 Pain in right wrist: Secondary | ICD-10-CM

## 2023-09-14 DIAGNOSIS — M533 Sacrococcygeal disorders, not elsewhere classified: Secondary | ICD-10-CM | POA: Insufficient documentation

## 2023-09-14 DIAGNOSIS — M9901 Segmental and somatic dysfunction of cervical region: Secondary | ICD-10-CM

## 2023-09-14 DIAGNOSIS — M1811 Unilateral primary osteoarthritis of first carpometacarpal joint, right hand: Secondary | ICD-10-CM

## 2023-09-14 DIAGNOSIS — M9902 Segmental and somatic dysfunction of thoracic region: Secondary | ICD-10-CM

## 2023-09-14 NOTE — Assessment & Plan Note (Signed)
Degenerative disc disease noted at multiple levels.  Has responded well to osteopathic manipulation.  Given injection into the sacroiliac joint today.  Tolerated the procedure well.  Hopefully will make a difference.  Discussed icing regimen.  Follow-up again in 6 to 8 weeks otherwise.

## 2023-09-14 NOTE — Assessment & Plan Note (Addendum)
Chronic problem with exacerbation.  Xrays ordered today to further quantify and show contralateral hand.  Does have significant arthritic changes of multiple joints.  Patient is considering the possibility of retirement in the near future.  Discussed recommendation exercises, which activities to do and which ones to avoid.  Increase activity slowly otherwise.  Follow-up again in 6 to 8 weeks

## 2023-09-14 NOTE — Patient Instructions (Addendum)
Xray today Nerve Conduction Study Ordered See you again in 6-8 weeks

## 2023-09-14 NOTE — Assessment & Plan Note (Signed)
Patient given the right side injected and tolerated the procedure well, discussed icing regimen of home exercises, which activities to do and which ones to avoid.  Increase activity slowly otherwise.  Follow-up again in 6 to 8 weeks

## 2023-09-23 NOTE — Progress Notes (Signed)
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  Headache rescue:  rizatriptan, methocarbamol Limit use of pain relievers to no more than 2 days out of week to prevent risk of rebound or medication-overuse headache. Keep headache diary Sees Dr. Katrinka Blazing of Sports Medicine for neck pain/cervical radiculopathy Follow up for Botox     Subjective:  Stephanie Hall is a 62 year old right-handed white female with hypertension who follows up for vertigo and migraine  UPDATE: Status post 2 rounds of Botox. Improved.  Headaches really seem to be aggravated by the neck No ocular migraines No significant vertigo spells. Duration:  brief with rizatriptan and methocarbamol Frequency:  3 a month  Has not had any of the atypical facial pain.     Current NSAIDs/analgesics:  Excedrin (rarely), tramadol (back pain) Current triptan:  rizatriptan 10mg  Current muscle relaxant:  methocarbamol Current blood pressure medication:  metoprolol, hydrochlorothiazide Current antidepressant:  Lexapro 10mg  daily Current antiepileptic medication:  none Other treatment:  Botox    HISTORY: She works as an Charity fundraiser at [employer].  On 10/12/2019, she was at work walking down the hall when she suddenly felt dizzy.  She noted spinning vertigo and headache.  She went home but the vertigo became worse and she had nausea and vomiting.  She returned to the ED for evaluation.  She was given meclizine and Zofran which were ineffective.  Initial head CT was normal.  She was transported to [facility] for an MRI.  MRI brain with and without contrast were normal. Labs and EKG revealed no cardiac abnormality, anemia or electrolyte  imbalance.  She was diagnosed with peripheral vertigo.  She felt wobbly for the next 3 days.  Then on 10/21/2019, she woke up with severe pounding and squeezing bilateral retro-orbital headache as well as nausea and feeling unsteady but no vertigo.  Symptoms subsided.  She had a recurrence on 10/25/2019 and symptoms have persisted.  She reports that her eyes feel like they are floating in syrup.  She reports some blurred vision, particularly with distance and her reading glasses don't work but she denies double vision and visual obscurations.  She has tinnitus but denies pulsatile tinnitus.  She reports right sided aural fullness.  Dizziness is aggravated by riding in a car.  She feels unsteady on her feet.  She finds it a little difficult to speak, like her tongue is swollen, as well as some word-finding difficulty.  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  She was placed on acetazolamide 500mg  twice daily and was started on a course of prednisone and has Valium on-hand.  I spoke with patient's ophthalmologist, Dr. Allyne Gee, who in retrospect wasn't convinced that she definitely had papilledema on exam.  I still wanted to proceed with workup with MRA/MRV of head and lumbar puncture.  However, thinking that her vertigo may be migraine, I wanted her to switch from acetazolamide to topiramate 25mg  twice daily.  MRA of head on 11/20/2019 showed possible moderate proximal left M2 stenosis but. follow up CTA of head on 11/30/2019 confirmed it was artifact.  MRV of head on 11/23/2019 showed no stenosis or thrombosis.  Lumbar punctre on 11/24/2019 demonstrated normal opening pressure of 16 cm water.  History of headaches all of her life described as severe pressure-like occipital pain radiating down the spine.     Typically wakes up with headaches and occurring at end of day as well.  They occur 3 times a week.  Takes Robaxin and Excedrin Migraine.   Sometimes not effective.  Also gets bi-frontal/temporal tension headaches.  They occur 2-3 times a week.  Also treats with Robaxin and Excedrin.  If headache more severe and associated with nausea, photophobia and phonophobia, will take rizatriptan with variable efficacy.  These headaches occur 1-2 times a week.  Takes Robaxin 3-4 days a week.  Takes Excedrin 3 days a week.  Takes rizatriptan 2 days a week.    She had been seeing Dr. Katrinka Blazing of Sports Medicine for her neck pain.  Has been treated with OMT, trigger point injections.  OMT helps.  Trigger point injections ineffective.  MRI of cervical spine revealed degenerative spine disease contributing to mild spinal stenosis and moderate left/severe right C6 neural foraminal stenosis at C5-6, moderate-severe right C4 neural foraminal stenosis. Takes gabapentin - recently increased from 200mg  to 300mg  at bedtime.     She also has history of ocular migraines presenting as scintillating scotoma lasting 20 minutes.  Every couple of months, she gets a left facial neuralgia - sharp twinge in the temple and a mild burning discomfort in the V1-V2 distribution.  May last a day.  It comes and goes since her 43s.  Saw neurology at that time.  Sed rate was unremarkable.     Past NSAIDs/analgesics:  meloxicam Past antiepileptic:  topiramate (cognitive deficits) Past antidepressant:  Cymbalta Past antiepileptic:  gabapentin 200mg  at bedtime (for neck pain) Past CGRP inhibitor:  Aimovig 140mg   PAST MEDICAL HISTORY: Past Medical History:  Diagnosis Date   Hypertension     MEDICATIONS: Current Outpatient Medications on File Prior to Visit  Medication Sig Dispense Refill   ALPRAZolam (XANAX) 0.5 MG tablet Take 0.5 mg by mouth as needed.     botulinum toxin Type A (BOTOX) 200 units injection Inject 200 Units into the muscle every 3 (three) months. Inject 155 units IM into multiple site in the face,neck and head once every 90 days 1 each 4   gabapentin  (NEURONTIN) 100 MG capsule Take 2 capsules (200 mg total) by mouth at bedtime. 180 capsule 0   hydrochlorothiazide (HYDRODIURIL) 25 MG tablet Take 25 mg by mouth daily.     LEXAPRO 10 MG tablet Take 10 mg by mouth daily.     meloxicam (MOBIC) 7.5 MG tablet Take 7.5 mg by mouth as needed for pain.     methocarbamol (ROBAXIN) 500 MG tablet As needed.     metoprolol succinate (TOPROL-XL) 50 MG 24 hr tablet Take 1 tablet by mouth 2 (two) times daily.     omeprazole (PRILOSEC) 40 MG capsule Take 1 capsule by mouth daily.     OVER THE COUNTER MEDICATION Multivitamin-Take 1 table by mouth daily.     rizatriptan (MAXALT-MLT) 10 MG disintegrating tablet TAKE 1 TABLET BY MOUTH EARLIEST ONSET OF MIGRAINE. MAY REPEAT IN 2 HOURS IF NEEDED. MAXIMUM 2 TABLETS IN 24 HOURS. 9 tablet 3   No current facility-administered medications on file prior to visit.    ALLERGIES: Allergies  Allergen Reactions   Neosporin [Bacitracin-Polymyxin B] Hives and Rash   Adhesive  [Tape] Rash   Contrast Media  [Iodinated Contrast Media] Itching, Dermatitis and Rash    FAMILY HISTORY: No family history on file.  Objective:  Blood pressure 138/84, pulse 76, SpO2 97%. General: No acute distress.  Patient appears well-groomed.   Head:  Normocephalic/atraumatic Head:  Normocephalic/atraumatic Neck:  Supple.  Trace right sided paraspinal tenderness.  Full range of motion. Heart:  Regular rate and rhythm. Neuro:  Alert and oriented.  Speech fluent and not dysarthric.  Language intact.  CN II-XII intact.  Bulk and tone normal.  Muscle strength 5/5 throughout.  Deep tendon reflexes 2+ throughout.  Gait normal.  Romberg negative.    Shon Millet, DO  CC: Adrian Prince, MD

## 2023-09-24 ENCOUNTER — Encounter: Payer: Self-pay | Admitting: Neurology

## 2023-09-24 ENCOUNTER — Ambulatory Visit (INDEPENDENT_AMBULATORY_CARE_PROVIDER_SITE_OTHER): Payer: BC Managed Care – PPO | Admitting: Neurology

## 2023-09-24 VITALS — BP 138/84 | HR 76

## 2023-09-24 DIAGNOSIS — M47812 Spondylosis without myelopathy or radiculopathy, cervical region: Secondary | ICD-10-CM | POA: Diagnosis not present

## 2023-09-24 DIAGNOSIS — G43809 Other migraine, not intractable, without status migrainosus: Secondary | ICD-10-CM | POA: Diagnosis not present

## 2023-09-24 DIAGNOSIS — G43009 Migraine without aura, not intractable, without status migrainosus: Secondary | ICD-10-CM | POA: Diagnosis not present

## 2023-09-24 DIAGNOSIS — G43109 Migraine with aura, not intractable, without status migrainosus: Secondary | ICD-10-CM

## 2023-10-08 ENCOUNTER — Other Ambulatory Visit (HOSPITAL_COMMUNITY): Payer: Self-pay

## 2023-10-08 ENCOUNTER — Telehealth: Payer: Self-pay | Admitting: Pharmacy Technician

## 2023-10-08 ENCOUNTER — Telehealth: Payer: Self-pay

## 2023-10-08 NOTE — Telephone Encounter (Signed)
PA needed Botox, Pa expire 10/15/23 appt 10/29/23

## 2023-10-08 NOTE — Telephone Encounter (Signed)
PA has been submitted, and telephone encounter has been created. 

## 2023-10-08 NOTE — Telephone Encounter (Signed)
Pharmacy Patient Advocate Encounter  BotoxOne verification has been submitted. Benefit Verification #:  [id number]  Pharmacy PA has been submitted for BOTOX 100u/200u: 200u via CoverMyMeds. INSURANCE: [employer] DATE SUBMITTED: 12.27.24 KEY: [id number] Status is pending

## 2023-10-09 ENCOUNTER — Encounter: Payer: Self-pay | Admitting: Neurology

## 2023-10-10 NOTE — Telephone Encounter (Signed)
Received addt questions via fax. Questiions have been answered and faxed back to number provided. [phone]

## 2023-10-18 ENCOUNTER — Other Ambulatory Visit (HOSPITAL_COMMUNITY): Payer: Self-pay

## 2023-10-18 ENCOUNTER — Encounter: Payer: Self-pay | Admitting: Neurology

## 2023-10-18 NOTE — Telephone Encounter (Signed)
 Pharmacy Patient Advocate Encounter  Received notification from [employer] that Prior Authorization for Botox  has been DENIED.  Full denial letter will be uploaded to the media tab. See denial reason below.   PA #/Case ID/Reference #: PA Case ID #: [id number] However, it appears additional information was requested, but a denial rendered prior to that additional information being sent in. Sent to [facility] Pharmacist for additional review, possible appeal. Cancellation PA Case ID # PA- [id number]

## 2023-10-19 ENCOUNTER — Other Ambulatory Visit (HOSPITAL_COMMUNITY): Payer: Self-pay

## 2023-10-19 ENCOUNTER — Telehealth: Payer: Self-pay | Admitting: Pharmacy Technician

## 2023-10-19 DIAGNOSIS — G43709 Chronic migraine without aura, not intractable, without status migrainosus: Secondary | ICD-10-CM

## 2023-10-19 NOTE — Telephone Encounter (Signed)
 Pharmacy Patient Advocate Encounter  BotoxOne verification has been submitted. Benefit Verification #:  [id number]   Pharmacy PA has been submitted for BOTOX  200u via CoverMyMeds. INSURANCE: [employer] DATE SUBMITTED: 10/19/23 KEY: [id number] Status is pending

## 2023-10-19 NOTE — Telephone Encounter (Signed)
 Pt's ins has changed.  New Encounter Created.

## 2023-10-24 ENCOUNTER — Other Ambulatory Visit (HOSPITAL_COMMUNITY): Payer: Self-pay

## 2023-10-24 NOTE — Telephone Encounter (Signed)
 Pharmacy Patient Advocate Encounter- Botox  BIV-Pharmacy Benefit:  PA was submitted to  and has been approved through: [facility] Authorization#   Please send prescription to Specialty Pharmacy: [facility]: [phone]  Estimated Copay is: $297   Patient IS eligible for Botox  Copay Card, which will make patient's copay as little as zero. Copay card will be provided to pharmacy.

## 2023-10-25 ENCOUNTER — Other Ambulatory Visit (HOSPITAL_COMMUNITY): Payer: Self-pay | Admitting: Pharmacy Technician

## 2023-10-25 ENCOUNTER — Other Ambulatory Visit (HOSPITAL_COMMUNITY): Payer: Self-pay

## 2023-10-25 ENCOUNTER — Other Ambulatory Visit: Payer: Self-pay

## 2023-10-25 MED ORDER — ONABOTULINUMTOXINA 200 UNITS IJ SOLR
200.0000 [IU] | INTRAMUSCULAR | 4 refills | Status: DC
  Filled 2023-10-25 – 2023-10-27 (×2): qty 1, 90d supply, fill #0
  Filled 2024-01-21: qty 1, 90d supply, fill #1
  Filled 2024-04-21: qty 1, 90d supply, fill #2
  Filled 2024-08-07: qty 1, 90d supply, fill #3

## 2023-10-25 NOTE — Telephone Encounter (Signed)
 Called [employer] to cancel Botox script.  New script sent to [facility].

## 2023-10-27 ENCOUNTER — Other Ambulatory Visit: Payer: BC Managed Care – PPO

## 2023-10-27 ENCOUNTER — Other Ambulatory Visit (HOSPITAL_COMMUNITY): Payer: Self-pay

## 2023-10-27 ENCOUNTER — Other Ambulatory Visit: Payer: Self-pay

## 2023-10-27 NOTE — Progress Notes (Signed)
 Specialty Pharmacy Initial Fill Coordination Note  Stephanie Hall is a 63 y.o. female contacted today regarding initial fill of specialty medication(s) OnabotulinumtoxinA  (BOTOX )   Patient requested Courier to Provider Office   Delivery date: 10/28/23   Verified address: [facility] [address], [place], [place], [address]   Medication will be filled on 10/27/2023.   Patient is aware of 0 copayment.

## 2023-10-27 NOTE — Progress Notes (Signed)
 I have attempted to contact this patient by phone with the following results: left message to return my call on answering machine.

## 2023-10-28 ENCOUNTER — Other Ambulatory Visit: Payer: BC Managed Care – PPO

## 2023-10-28 ENCOUNTER — Other Ambulatory Visit: Payer: Self-pay

## 2023-10-29 ENCOUNTER — Encounter: Payer: Medicare Other | Admitting: Neurology

## 2023-10-29 ENCOUNTER — Ambulatory Visit (INDEPENDENT_AMBULATORY_CARE_PROVIDER_SITE_OTHER): Payer: Medicare Other | Admitting: Neurology

## 2023-10-29 DIAGNOSIS — G43709 Chronic migraine without aura, not intractable, without status migrainosus: Secondary | ICD-10-CM | POA: Diagnosis not present

## 2023-10-29 MED ORDER — ONABOTULINUMTOXINA 100 UNITS IJ SOLR
200.0000 [IU] | Freq: Once | INTRAMUSCULAR | Status: AC
  Administered 2023-10-29: 155 [IU] via INTRAMUSCULAR

## 2023-10-29 NOTE — Progress Notes (Signed)
[facility]  ° °Procedure Note Botox ° °Attending: Dr.   ° °Preoperative Diagnosis(es): Chronic migraine ° °Consent obtained from: The patient °Benefits discussed included, but were not limited to decreased muscle tightness, increased joint range of motion, and decreased pain.  Risk discussed included, but were not limited pain and discomfort, bleeding, bruising, excessive weakness, venous thrombosis, muscle atrophy and dysphagia.  Anticipated outcomes of the procedure as well as he risks and benefits of the alternatives to the procedure, and the roles and tasks of the personnel to be involved, were discussed with the patient, and the patient consents to the procedure and agrees to proceed. A copy of the patient medication guide was given to the patient which explains the blackbox warning. ° °Patients identity and treatment sites confirmed Yes.  . ° °Details of Procedure: °Skin was cleaned with alcohol. Prior to injection, the needle plunger was aspirated to make sure the needle was not within a blood vessel.  There was no blood retrieved on aspiration.   ° °Following is a summary of the muscles injected  And the amount of Botulinum toxin used: ° °Dilution °200 units of Botox was reconstituted with 4 ml of preservative free normal saline. °Time of reconstitution: At the time of the office visit (<30 minutes prior to injection)  ° °Injections  °155 total units of Botox was injected with a 30 gauge needle. ° °Injection Sites: °L occipitalis: 15 units- 3 sites  °R occiptalis: 15 units- 3 sites ° °L upper trapezius: 15 units- 3 sites °R upper trapezius: 15 units- 3 sits          °L paraspinal: 10 units- 2 sites °R paraspinal: 10 units- 2 sites ° °Face °L frontalis(2 injection sites):10 units   °R frontalis(2 injection sites):10 units         °L corrugator: 5 units   °R corrugator: 5 units           °Procerus: 5 units   °L temporalis: 20 units °R temporalis: 20 units  ° °Agent:  °200 units of botulinum Type  A (Onobotulinum Toxin type A) was reconstituted with 4 ml of preservative free normal saline.  °Time of reconstitution: At the time of the office visit (<30 minutes prior to injection)  ° ° ° Total injected (Units):  155 ° Total wasted (Units):  45 ° °Patient tolerated procedure well without complications.   °Reinjection is anticipated in 3 months. ° ° °

## 2023-10-29 NOTE — Progress Notes (Signed)
[name] [address] [place] [address] Phone: [phone] Subjective:   Stephanie Hall, am serving as a scribe for Dr. Antoine Primas.  I'm seeing this patient by the request  of:  Adrian Prince, MD  CC: Back and neck pain follow-up  [id number]  Stephanie Hall is a 63 y.o. female coming in with complaint of back and neck pain. OMT on 09/14/2023. Also seen for wrist pain. Patient states overall doing relatively well.  Feels like the wrist is seeming to make some improvement.  Tightness noted in multiple areas though.  Medications patient has been prescribed:   Taking:         Reviewed prior external information including notes and imaging from previsou exam, outside providers and external EMR if available.   As well as notes that were available from care everywhere and other healthcare systems.  Past medical history, social, surgical and family history all reviewed in electronic medical record.  No pertanent information unless stated regarding to the chief complaint.   Past Medical History:  Diagnosis Date   Hypertension     Allergies  Allergen Reactions   Neosporin [Bacitracin-Polymyxin B] Hives and Rash   Adhesive  [Tape] Rash   Contrast Media  [Iodinated Contrast Media] Itching, Dermatitis and Rash     Review of Systems:  No headache, visual changes, nausea, vomiting, diarrhea, constipation, dizziness, abdominal pain, skin rash, fevers, chills, night sweats, weight loss, swollen lymph nodes, body aches, joint swelling, chest pain, shortness of breath, mood changes. POSITIVE muscle aches  Objective  Blood pressure 120/82, pulse 71, height 5\' 4"  (1.626 m), SpO2 98%.   General: No apparent distress alert and oriented x3 mood and affect normal, dressed appropriately.  HEENT: Pupils equal, extraocular movements intact  Respiratory: Patient's speak in full sentences and does not appear short of breath  Cardiovascular: No  lower extremity edema, non tender, no erythema  Gait relatively normal MSK:  Back still has significant tightness noted.  Loss of lordosis noted.  Difficulty with even straight leg test but no true radicular symptoms.  Osteopathic findings  C2 flexed rotated and side bent right C6 flexed rotated and side bent left T3 extended rotated and side bent right inhaled rib T6 extended rotated and side bent left L2 flexed rotated and side bent right Sacrum right on right       Assessment and Plan:  DDD (degenerative disc disease), lumbar Degenerative disc disease with acute exacerbation of the at work.  Seems ligament composition as well and is awaiting radiofrequency ablation which I do think will be significantly beneficial for this individual.  Patient has been using osteopathic manipulation as another modality when appropriate after evaluation.  Patient did respond extremely well for that today.  We discussed icing regimen.  Discussed home exercises and core strengthening where possible.  No significant change in medications at the moment.  Will put in an order for formal physical therapy near patient's house that I think will be helpful.  Follow-up again with me in 2 months    Nonallopathic problems  Decision today to treat with OMT was based on Physical Exam  After verbal consent patient was treated with , ME, FPR techniques in cervical, rib, thoracic, lumbar, and sacral  areas avoided HVLA secondary to the tightness noted.  Patient tolerated the procedure well with improvement in symptoms  Patient given exercises, stretches and lifestyle modifications  See medications in patient instructions if given  Patient will follow  up in 4-8 weeks    The above documentation has been reviewed and is accurate and complete Judi Saa, DO          Note: This dictation was prepared with Dragon dictation along with smaller phrase technology. Any transcriptional errors that result  from this process are unintentional.

## 2023-11-03 ENCOUNTER — Ambulatory Visit
Admission: RE | Admit: 2023-11-03 | Discharge: 2023-11-03 | Disposition: A | Discharge status: Home / Self Care | Payer: Medicare Other | Source: Ambulatory Visit | Attending: Emergency Medicine | Admitting: Emergency Medicine

## 2023-11-03 DIAGNOSIS — J849 Interstitial pulmonary disease, unspecified: Secondary | ICD-10-CM | POA: Diagnosis not present

## 2023-11-03 DIAGNOSIS — I700 Atherosclerosis of aorta: Secondary | ICD-10-CM | POA: Diagnosis not present

## 2023-11-04 ENCOUNTER — Ambulatory Visit (INDEPENDENT_AMBULATORY_CARE_PROVIDER_SITE_OTHER): Payer: Medicare Other | Admitting: Family Medicine

## 2023-11-04 VITALS — BP 120/82 | HR 71 | Ht 64.0 in

## 2023-11-04 DIAGNOSIS — M51362 Other intervertebral disc degeneration, lumbar region with discogenic back pain and lower extremity pain: Secondary | ICD-10-CM

## 2023-11-04 DIAGNOSIS — M1811 Unilateral primary osteoarthritis of first carpometacarpal joint, right hand: Secondary | ICD-10-CM | POA: Diagnosis not present

## 2023-11-04 DIAGNOSIS — M9901 Segmental and somatic dysfunction of cervical region: Secondary | ICD-10-CM

## 2023-11-04 DIAGNOSIS — M9903 Segmental and somatic dysfunction of lumbar region: Secondary | ICD-10-CM | POA: Diagnosis not present

## 2023-11-04 DIAGNOSIS — M9902 Segmental and somatic dysfunction of thoracic region: Secondary | ICD-10-CM

## 2023-11-04 DIAGNOSIS — M9908 Segmental and somatic dysfunction of rib cage: Secondary | ICD-10-CM

## 2023-11-04 DIAGNOSIS — M9904 Segmental and somatic dysfunction of sacral region: Secondary | ICD-10-CM

## 2023-11-04 NOTE — Assessment & Plan Note (Signed)
Degenerative disc disease with acute exacerbation of the at work.  Seems ligament composition as well and is awaiting radiofrequency ablation which I do think will be significantly beneficial for this individual.  Patient has been using osteopathic manipulation as another modality when appropriate after evaluation.  Patient did respond extremely well for that today.  We discussed icing regimen.  Discussed home exercises and core strengthening where possible.  No significant change in medications at the moment.  Will put in an order for formal physical therapy near patient's house that I think will be helpful.  Follow-up again with me in 2 months

## 2023-11-04 NOTE — Patient Instructions (Addendum)
Good to see you  PT ordered [place]  See me again in 2-3 months

## 2023-11-05 ENCOUNTER — Ambulatory Visit: Payer: BC Managed Care – PPO | Admitting: Emergency Medicine

## 2023-11-06 ENCOUNTER — Encounter: Payer: Self-pay | Admitting: Family Medicine

## 2023-11-06 NOTE — Assessment & Plan Note (Signed)
Stable after the injection.  No other change in management today.

## 2023-11-18 ENCOUNTER — Ambulatory Visit: Payer: Medicare Other | Admitting: Emergency Medicine

## 2023-11-18 ENCOUNTER — Encounter: Payer: Self-pay | Admitting: Emergency Medicine

## 2023-11-18 VITALS — BP 116/84 | HR 73 | Ht 64.0 in | Wt 185.0 lb

## 2023-11-18 DIAGNOSIS — G478 Other sleep disorders: Secondary | ICD-10-CM

## 2023-11-18 DIAGNOSIS — J849 Interstitial pulmonary disease, unspecified: Secondary | ICD-10-CM | POA: Diagnosis not present

## 2023-11-18 DIAGNOSIS — Z23 Encounter for immunization: Secondary | ICD-10-CM | POA: Diagnosis not present

## 2023-11-18 NOTE — Assessment & Plan Note (Signed)
 We will repeat a high-resolution CT scan of the chest in January 2026 We will perform pulmonary function testing in January 2026 to compare with your priors. Follow Dr. Shelah in January 2026 to review your studies.  Please call sooner if you develop any changes in your breathing.  If so we may decide to repeat your study sooner.

## 2023-11-18 NOTE — Addendum Note (Signed)
 Addended by: Josealberto Montalto M on: 11/18/2023 02:59 PM   Modules accepted: Orders

## 2023-11-18 NOTE — Patient Instructions (Signed)
 We will repeat a high-resolution CT scan of the chest in January 2026 We will perform pulmonary function testing in January 2026 to compare with your priors. Please continue your omeprazole 40 mg once daily.  Consider taking this about 1 hour after dinner instead of at bedtime Continue your fluticasone nasal spray and your cetirizine as you have been taking them Follow Dr. Shelah in January 2026 to review your studies.  Please call sooner if you develop any changes in your breathing.  If so we may decide to repeat your study sooner.

## 2023-11-18 NOTE — Progress Notes (Signed)
 Subjective:    Patient ID: Stephanie Hall, female    DOB: [birth year], 63 y.o.   MRN: [id number]  HPI  ROV 12/10/22 --[name] follows up today for history of cough and interstitial lung disease noted on chest imaging.  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  At her last visit 1 month ago I stopped her Breztri and we work to try and modify her allergic rhinitis, temporarily increased her omeprazole to 40 mg twice a day.  Autoimmune labs were performed which were negative as below.  She underwent pulmonary function testing today.  Today she reports that her cough is better. She still feels a globus sensation. Still feels that she needs to clear her throat.    Labs 11/05/2022: Negative anti-Smith, weak 1:40 ANA, negative ANCA, negative anti-smooth muscle, negative RF, negative Scl-70, negative anti-Jo, negative dsDNA, normal aldolase Labs 05/25/2022: Weak ANA 1:40, ACE level 37, CCP negative  Pulmonary function testing performed today and reviewed by me show normal spirometry without a bronchodilator response, possible restriction based on the decreased RV (TLC is normal), normal diffusion capacity.  ROV 11/18/23 --follow-up visit for 63 year old woman with a history of chronic cough and mild interstitial lung disease noted on chest imaging.  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  Pulmonary function testing with evidence for possible mild restriction.  Her autoimmune evaluation is negative.  She had a repeat high-res CT chest as below.  She deals with chronic cough, has some allergic rhinitis and chronic reflux.  Has been managed with omeprazole 40 mg..  Today she reports that she continues to have dry cough, no real change compared with priors.  This happens to her especially at night, non-productive. She can sometimes feel reflux. She has a lot of congestion, uses zyrtec at  bedtime, flonase every day.   High-resolution CT scan of the chest done 11/03/2023 and reviewed by me, shows some mild to moderate patchy air trapping bilaterally without definite tracheobronchomalacia.  Some mild patchy peripheral reticulation and some associated traction bronchiectasis without frank honeycomb change.  May be slightly better than her CT chest from 10/27/2022.  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   Review of Systems As per HPI     Objective:   Physical Exam Vitals:   11/18/23 1431 11/18/23 1432  BP:  116/84  Pulse: 73   SpO2: 96%   Weight:  185 lb (83.9 kg)  Height:  5' 4 (1.626 m)   Gen: Pleasant, well-nourished, in no distress,  normal affect  ENT: No lesions,  mouth clear,  oropharynx clear, no postnasal drip  Neck: No JVD, no stridor  Lungs: No use of accessory muscles, no crackles or wheezing on normal respiration, no wheeze on forced expiration  Cardiovascular: RRR, heart sounds normal, no murmur or gallops, no peripheral edema  Musculoskeletal: No deformities, no cyanosis or clubbing  Neuro: alert, awake, non focal  Skin: Warm, no lesions or rash      Assessment & Plan:  ILD (interstitial lung disease) (HCC) We will repeat a high-resolution CT scan of the chest in January 2026 We will perform pulmonary function testing in January 2026 to compare with your priors. Follow Dr. Shelah in January 2026 to review your studies.  Please call sooner if you develop any changes in your breathing.  If so we may decide to repeat your study  sooner.  Upper airway resistance syndrome With daily dry cough.  More intrusive at night.  She is taking her omeprazole at bedtime and would like for her to move it up to earlier in the evening so we will be effective when she lays down for bed.  Did discuss with her that if she is having increased coughing she could consider doing omeprazole twice daily for 2 weeks and then going back to once a day.  She has had benefit from this in  the past.  Continue her allergy regimen as well  Please continue your omeprazole 40 mg once daily.  Consider taking this about 1 hour after dinner instead of at bedtime Continue your fluticasone nasal spray and your cetirizine as you have been taking them    Lamar Chris, MD, PhD 11/18/2023, 2:51 PM [facility] [phone] or if no answer before 7:00PM call [phone] For any issues after 7:00PM please call eLink [phone]

## 2023-11-18 NOTE — Assessment & Plan Note (Addendum)
 With daily dry cough.  More intrusive at night.  She is taking her omeprazole at bedtime and would like for her to move it up to earlier in the evening so we will be effective when she lays down for bed.  Did discuss with her that if she is having increased coughing she could consider doing omeprazole twice daily for 2 weeks and then going back to once a day.  She has had benefit from this in the past.  Continue her allergy regimen as well  Please continue your omeprazole 40 mg once daily.  Consider taking this about 1 hour after dinner instead of at bedtime Continue your fluticasone nasal spray and your cetirizine as you have been taking them

## 2023-11-25 ENCOUNTER — Encounter: Payer: Self-pay | Admitting: Family Medicine

## 2023-11-25 ENCOUNTER — Ambulatory Visit: Payer: Medicare Other | Admitting: Neurology

## 2023-11-25 ENCOUNTER — Other Ambulatory Visit: Payer: Self-pay

## 2023-11-25 DIAGNOSIS — R202 Paresthesia of skin: Secondary | ICD-10-CM

## 2023-11-25 DIAGNOSIS — G5603 Carpal tunnel syndrome, bilateral upper limbs: Secondary | ICD-10-CM

## 2023-11-25 DIAGNOSIS — M5412 Radiculopathy, cervical region: Secondary | ICD-10-CM

## 2023-11-25 NOTE — Procedures (Signed)
[facility]  [address] [place], [address]  [place], [place] [address] Tel: [phone] Fax: [phone] Test Date:  11/25/2023  Patient: Stephanie Hall DOB: 26-Dec-1960 Physician: Nita Sickle, DO  Sex: Female Height: 5\' 4"  Ref Phys: Antoine Primas, DO  ID#: [id number]   Technician:    History: This is a 63 year old female referred for evaluation of bilateral wrist pain.  NCV & EMG Findings: Extensive electrodiagnostic testing of the right upper extremity and additional studies of the left shows:  Bilateral mixed palmar sensory responses show mildly prolonged latency.  Bilateral median and ulnar sensory responses are within normal limits. Bilateral median and ulnar motor responses are within normal limits. Chronic motor axon loss changes are seen affecting the right biceps and deltoid muscles, without accompanying active denervation.  These findings are not present on the left.  Impression: Chronic C5-C6 radiculopathy affecting the right upper extremity, mild. Bilateral median neuropathy at or distal to the wrist, consistent with a clinical diagnosis of carpal tunnel syndrome.  Overall, these findings are very mild in degree electrically.   ___________________________ Nita Sickle, DO    Nerve Conduction Studies   Stim Site NR Peak (ms) Norm Peak (ms) O-P Amp (V) Norm O-P Amp  Left Median Anti Sensory (2nd Digit)  32 C  Wrist    3.2 <3.8 18.5 >10  Right Median Anti Sensory (2nd Digit)  32 C  Wrist    3.2 <3.8 19.1 >10  Left Ulnar Anti Sensory (5th Digit)  32 C  Wrist    2.6 <3.2 24.4 >5  Right Ulnar Anti Sensory (5th Digit)  32 C  Wrist    2.4 <3.2 25.7 >5     Stim Site NR Onset (ms) Norm Onset (ms) O-P Amp (mV) Norm O-P Amp Site1 Site2 Delta-0 (ms) Dist (cm) Vel (m/s) Norm Vel (m/s)  Left Median Motor (Abd Poll Brev)  32 C  Wrist    3.0 <4.0 10.0 >5 Elbow Wrist 4.7 27.0 57 >50  Elbow    7.7  9.4         Right Median Motor (Abd Poll Brev)  32 C  Wrist     2.9 <4.0 10.7 >5 Elbow Wrist 4.8 29.0 60 >50  Elbow    7.7  10.2         Left Ulnar Motor (Abd Dig Minimi)  32 C  Wrist    2.2 <3.1 9.1 >7 B Elbow Wrist 3.5 22.0 63 >50  B Elbow    5.7  8.4  A Elbow B Elbow 1.4 8.0 57 >50  A Elbow    7.1  7.9         Right Ulnar Motor (Abd Dig Minimi)  32 C  Wrist    2.2 <3.1 9.4 >7 B Elbow Wrist 3.4 22.0 65 >50  B Elbow    5.6  9.4  A Elbow B Elbow 1.4 10.0 71 >50  A Elbow    7.0  9.2            Stim Site NR Peak (ms) Norm Peak (ms) P-T Amp (V) Site1 Site2 Delta-P (ms) Norm Delta (ms)  Left Median/Ulnar Palm Comparison (Wrist - 8cm)  32 C  Median Palm    1.8 <2.2 52.7 Median Palm Ulnar Palm *0.4   Ulnar Palm    1.4 <2.2 11.2      Right Median/Ulnar Palm Comparison (Wrist - 8cm)  32 C  Median Palm    1.8 <2.2 72.3 Median Palm  Ulnar Palm *0.6   Ulnar Palm    1.2 <2.2 7.0       Electromyography   Side Muscle Ins.Act Fibs Fasc Recrt Amp Dur Poly Activation Comment  Right 1stDorInt Nml Nml Nml Nml Nml Nml Nml Nml N/A  Right Abd Poll Brev Nml Nml Nml Nml Nml Nml Nml Nml N/A  Right PronatorTeres Nml Nml Nml Nml Nml Nml Nml Nml N/A  Right Biceps Nml Nml Nml *1- *1+ *1+ *1+ Nml N/A  Right Triceps Nml Nml Nml Nml Nml Nml Nml Nml N/A  Right Deltoid Nml Nml Nml *1- *1+ *1+ *1+ Nml N/A  Left 1stDorInt Nml Nml Nml Nml Nml Nml Nml Nml N/A  Left Abd Poll Brev Nml Nml Nml Nml Nml Nml Nml Nml N/A  Left PronatorTeres Nml Nml Nml Nml Nml Nml Nml Nml N/A  Left Biceps Nml Nml Nml Nml Nml Nml Nml Nml N/A  Left Triceps Nml Nml Nml Nml Nml Nml Nml Nml N/A  Left Deltoid Nml Nml Nml Nml Nml Nml Nml Nml N/A      Waveforms:

## 2023-12-01 DIAGNOSIS — K080 Exfoliation of teeth due to systemic causes: Secondary | ICD-10-CM | POA: Diagnosis not present

## 2023-12-01 NOTE — Progress Notes (Unsigned)
Virtual Visit via Video Note  Consent was obtained for video visit:  Yes.   Answered questions that patient had about telehealth interaction:  Yes.   I discussed the limitations, risks, security and privacy concerns of performing an evaluation and management service by telemedicine. I also discussed with the patient that there may be a patient responsible charge related to this service. The patient expressed understanding and agreed to proceed.  Pt location: Home Physician Location: office Name of referring provider:  Adrian Prince, MD I connected with Stephanie Hall at patients initiation/request on 12/02/2023 at  9:50 AM EST by video enabled telemedicine application and verified that I am speaking with the correct person using two identifiers. 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  Headache rescue:  rizatriptan.  Change from methocarbamol to baclofen 10mg .  If ineffective, will restart methocarbamol and change from rizatriptan to sumatriptan Limit use of pain relievers to no more than 2 days out of week to prevent risk of rebound or medication-overuse headache. Keep headache diary Sees Dr. Katrinka Blazing of [facility] Medicine for neck pain/cervical radiculopathy Follow up for routine visit in 6 onths.     Subjective:  Stephanie Hall is a 63 year old right-handed white female with hypertension who follows up for vertigo and migraine  UPDATE: Status post 3 rounds of Botox. Headaches really seem to be aggravated by the neck 1 ocular migraine over last several weeks. No significant vertigo spells. Intensity:  moderate Duration: several hours with rizatriptan and  methocarbamol Frequency:  4 a month (3 are clearly migraine)  Has not had any of the atypical facial pain.    Followed by Dr. Katrinka Blazing for chronic neck pain.  Also endorsed bilateral wrist pain.  Had NCV-EMG of upper extremities on 11/25/2023 showed findings of mild chronic right C5-C6 radiculopathy and mild bilateral carpal tunnel syndrome.  Following up with Dr. Katrinka Blazing.   Current NSAIDs/analgesics:  Excedrin (rarely), tramadol (back pain) Current triptan:  rizatriptan 10mg  Current muscle relaxant:  methocarbamol Current blood pressure medication:  metoprolol, hydrochlorothiazide Current antidepressant:  Lexapro 10mg  daily Current antiepileptic medication:  none Other treatment:  Botox    HISTORY: She works as an Charity fundraiser at [employer].  On 10/12/2019, she was at work walking down the hall when she suddenly felt dizzy.  She noted spinning vertigo and headache.  She went home but the vertigo became worse and she had nausea and vomiting.  She returned to the ED for evaluation.  She was given meclizine and Zofran which were ineffective.  Initial head CT was normal.  She was transported to [facility] for an [facility].  MRI brain with and without contrast were normal. Labs and EKG revealed no cardiac abnormality, anemia or electrolyte imbalance.  She was diagnosed with peripheral vertigo.  She felt wobbly for the next 3 days.  Then on 10/21/2019, she woke up with severe pounding and squeezing bilateral retro-orbital headache as well as nausea and feeling unsteady but no vertigo.  Symptoms subsided.  She had a recurrence on 10/25/2019 and symptoms have persisted.  She reports that her eyes feel like they are floating in syrup.  She reports some blurred vision, particularly with distance and her reading glasses don't work but she denies double vision and visual obscurations.  She has tinnitus but denies pulsatile tinnitus.  She reports right sided aural fullness.  Dizziness is aggravated by riding in a car.  She feels  unsteady on her feet.  She finds it a little difficult to speak, like her tongue is swollen, as well as some word-finding difficulty.  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  She was placed on acetazolamide 500mg  twice daily and was started on a course of prednisone and has Valium on-hand.  I spoke with patient's ophthalmologist, Dr. Allyne Gee, who in retrospect wasn't convinced that she definitely had papilledema on exam.  I still wanted to proceed with workup with MRA/MRV of head and lumbar puncture.  However, thinking that her vertigo may be migraine, I wanted her to switch from acetazolamide to topiramate 25mg  twice daily.  MRA of head on 11/20/2019 showed possible moderate proximal left M2 stenosis but. follow up CTA of head on 11/30/2019 confirmed it was artifact.  MRV of head on 11/23/2019 showed no stenosis or thrombosis.  Lumbar punctre on 11/24/2019 demonstrated normal opening pressure of 16 cm water.   History of headaches all of her life described as severe pressure-like occipital pain radiating down the spine.     Typically wakes up with headaches and occurring at end of day as well.  They occur 3 times a week.  Takes Robaxin and Excedrin Migraine.  Sometimes not effective.  Also gets bi-frontal/temporal tension headaches.  They occur 2-3 times a week.  Also treats with Robaxin and Excedrin.  If headache more severe and associated with nausea, photophobia and phonophobia, will take rizatriptan with variable efficacy.  These headaches occur 1-2 times a week.  Takes Robaxin 3-4 days a week.  Takes Excedrin 3 days a week. Triggers include alcohol and neck pain.    She had been seeing Dr. Katrinka Blazing of Sports Medicine for her neck pain.  Has been treated with OMT, trigger point injections.  OMT helps.  Trigger point injections ineffective.  MRI of cervical spine revealed degenerative spine disease contributing to mild spinal stenosis and  moderate left/severe right C6 neural foraminal stenosis at C5-6, moderate-severe right C4 neural foraminal stenosis. Takes gabapentin - recently increased from 200mg  to 300mg  at bedtime.     She also has history of ocular migraines presenting as scintillating scotoma lasting 20 minutes.  Every couple of months, she gets a left facial neuralgia - sharp twinge in the temple and a mild burning discomfort in the V1-V2 distribution.  May last a day.  It comes and goes since her 70s.  Saw neurology at that time.  Sed rate was unremarkable.     Past NSAIDs/analgesics:  meloxicam Past antiepileptic:  topiramate (cognitive deficits) Past antidepressant:  Cymbalta Past antiepileptic:  gabapentin 200mg  at bedtime (for neck pain) Past CGRP inhibitor:  Aimovig 140mg   Past Medical History: Past Medical History:  Diagnosis Date   Hypertension     Medications: Outpatient Encounter Medications as of 12/02/2023  Medication Sig   ALPRAZolam (XANAX) 0.5 MG tablet Take 0.5 mg by mouth as needed.   botulinum toxin Type A (BOTOX) 200 units injection Inject 200 Units into the muscle every 3 (three) months. Inject 155 units IM into multiple site in the face,neck and head once every 90 days   hydrochlorothiazide (HYDRODIURIL) 25 MG tablet Take 25 mg by mouth daily.   LEXAPRO 10 MG tablet Take 10 mg by mouth daily.   methocarbamol (ROBAXIN) 500  MG tablet As needed.   metoprolol succinate (TOPROL-XL) 50 MG 24 hr tablet Take 1 tablet by mouth 2 (two) times daily.   omeprazole (PRILOSEC) 40 MG capsule Take 1 capsule by mouth daily.   OVER THE COUNTER MEDICATION Multivitamin-Take 1 table by mouth daily.   REPATHA SURECLICK 140 MG/ML SOAJ Inject 140 mg into the skin every 14 (fourteen) days.   rizatriptan (MAXALT-MLT) 10 MG disintegrating tablet TAKE 1 TABLET BY MOUTH EARLIEST ONSET OF MIGRAINE. MAY REPEAT IN 2 HOURS IF NEEDED. MAXIMUM 2 TABLETS IN 24 HOURS.   ULTRAM 50 MG tablet Take 50 mg by mouth as needed.   No  facility-administered encounter medications on file as of 12/02/2023.    Allergies: Allergies  Allergen Reactions   Neosporin [Bacitracin-Polymyxin B] Hives and Rash   Adhesive  [Tape] Rash   Contrast Media  [Iodinated Contrast Media] Itching, Dermatitis and Rash    Family History: No family history on file.  Observations/Objective:   No acute distress.  Alert and oriented.  Speech fluent and not dysarthric.  Language intact.     Follow Up Instructions:    -I discussed the assessment and treatment plan with the patient. The patient was provided an opportunity to ask questions and all were answered. The patient agreed with the plan and demonstrated an understanding of the instructions.   The patient was advised to call back or seek an in-person evaluation if the symptoms worsen or if the condition fails to improve as anticipated.   Cira Servant, DO

## 2023-12-02 ENCOUNTER — Encounter: Payer: Self-pay | Admitting: Neurology

## 2023-12-02 ENCOUNTER — Telehealth (INDEPENDENT_AMBULATORY_CARE_PROVIDER_SITE_OTHER): Payer: Medicare Other | Admitting: Neurology

## 2023-12-02 DIAGNOSIS — M47812 Spondylosis without myelopathy or radiculopathy, cervical region: Secondary | ICD-10-CM

## 2023-12-02 DIAGNOSIS — G43009 Migraine without aura, not intractable, without status migrainosus: Secondary | ICD-10-CM | POA: Diagnosis not present

## 2023-12-02 DIAGNOSIS — G43809 Other migraine, not intractable, without status migrainosus: Secondary | ICD-10-CM

## 2023-12-02 DIAGNOSIS — G43109 Migraine with aura, not intractable, without status migrainosus: Secondary | ICD-10-CM

## 2023-12-02 DIAGNOSIS — G501 Atypical facial pain: Secondary | ICD-10-CM | POA: Diagnosis not present

## 2023-12-02 MED ORDER — BACLOFEN 10 MG PO TABS: 10.0000 mg | ORAL_TABLET | Freq: Three times a day (TID) | ORAL | 5 refills | Status: AC | PRN

## 2023-12-02 NOTE — Patient Instructions (Signed)
Instead of methocarbamol, take baclofen with rizatriptan.  Let me know if there are any problems

## 2023-12-08 NOTE — Progress Notes (Unsigned)
 [name] [address] [place] [address] Phone: [phone] Subjective:   Bruce Donath, am serving as a scribe for Dr. Antoine Primas.  I'm seeing this patient by the request  of:  Adrian Prince, MD  CC: Wrist pain, thumb pain  IEP:[id number]  Stephanie Hall is a 63 y.o. female coming in with complaint of back and neck pain. OMT 11/04/2023. Also f/u for R [facility] jt pain. Would like to discuss B carpal tunnel and results from EMG. Patient states that her neck pain has been burning in nature. Has a spot that is tender to touch. Pain seems to be worse.   Medications patient has been prescribed: None  Taking:         Reviewed prior external information including notes and imaging from previsou exam, outside providers and external EMR if available.   As well as notes that were available from care everywhere and other healthcare systems.  Past medical history, social, surgical and family history all reviewed in electronic medical record.  No pertanent information unless stated regarding to the chief complaint.   Past Medical History:  Diagnosis Date   Hypertension     Allergies  Allergen Reactions   Neosporin [Bacitracin-Polymyxin B] Hives and Rash   Adhesive  [Tape] Rash   Contrast Media  [Iodinated Contrast Media] Itching, Dermatitis and Rash     Review of Systems:  No headache, visual changes, nausea, vomiting, diarrhea, constipation, dizziness, abdominal pain, skin rash, fevers, chills, night sweats, weight loss, swollen lymph nodes, body aches, joint swelling, chest pain, shortness of breath, mood changes. POSITIVE muscle aches  Objective  Blood pressure 112/74, pulse 74, height 5\' 4"  (1.626 m), SpO2 96%.   General: No apparent distress alert and oriented x3 mood and affect normal, dressed appropriately.  HEENT: Pupils equal, extraocular movements intact  Respiratory: Patient's speak in full sentences and does not appear short  of breath  Cardiovascular: No lower extremity edema, non tender, no erythema  Gait normal  MSK:  Back does have some loss lordosis noted.  Neck exam does have significant loss of lordosis noted.  Some tenderness to palpation in the paraspinal musculature.  Osteopathic findings  C3 flexed rotated and side bent right C7 flexed rotated and side bent right  T3 extended rotated and side bent right inhaled rib T9 extended rotated and side bent left L2 flexed rotated and side bent right Sacrum right on right  Wrist exam shows good range of motion noted at the moment.  Some mild Tinel's noted.     Assessment and Plan:  DJD (degenerative joint disease) of cervical spine Significant degenerative disc disease and nerve conduction study did show the C6 nerve root impingement noted.  I do believe at this point that an epidural in the cervical spine could be beneficial.  Recently did have radiofrequency ablation of the lower back.  Hopeful that this will make significant difference.  We discussed also can consider the possibility of a carpal tunnel injection but it was only mild upon testing.  Will see how patient responds to this.  Responded extremely well to osteopathic manipulation and muscle energy today as another treatment option.  Follow-up with me again 6 weeks after the injection.    Nonallopathic problems  Decision today to treat with OMT was based on Physical Exam  After verbal consent patient was treated with HVLA, ME, FPR techniques in cervical, rib, thoracic, lumbar, and sacral  areas  Patient tolerated the procedure  well with improvement in symptoms  Patient given exercises, stretches and lifestyle modifications  See medications in patient instructions if given  Patient will follow up in 4-8 weeks     The above documentation has been reviewed and is accurate and complete Judi Saa, DO         Note: This dictation was prepared with Dragon dictation along with  smaller phrase technology. Any transcriptional errors that result from this process are unintentional.

## 2023-12-09 ENCOUNTER — Encounter: Payer: Self-pay | Admitting: Family Medicine

## 2023-12-09 ENCOUNTER — Ambulatory Visit: Payer: Medicare Other | Admitting: Family Medicine

## 2023-12-09 ENCOUNTER — Other Ambulatory Visit: Payer: Self-pay

## 2023-12-09 VITALS — BP 112/74 | HR 74 | Ht 64.0 in

## 2023-12-09 DIAGNOSIS — G5602 Carpal tunnel syndrome, left upper limb: Secondary | ICD-10-CM

## 2023-12-09 DIAGNOSIS — M9902 Segmental and somatic dysfunction of thoracic region: Secondary | ICD-10-CM

## 2023-12-09 DIAGNOSIS — M9903 Segmental and somatic dysfunction of lumbar region: Secondary | ICD-10-CM

## 2023-12-09 DIAGNOSIS — M47812 Spondylosis without myelopathy or radiculopathy, cervical region: Secondary | ICD-10-CM | POA: Diagnosis not present

## 2023-12-09 DIAGNOSIS — M5412 Radiculopathy, cervical region: Secondary | ICD-10-CM

## 2023-12-09 DIAGNOSIS — M9908 Segmental and somatic dysfunction of rib cage: Secondary | ICD-10-CM

## 2023-12-09 DIAGNOSIS — M9901 Segmental and somatic dysfunction of cervical region: Secondary | ICD-10-CM | POA: Diagnosis not present

## 2023-12-09 DIAGNOSIS — M9904 Segmental and somatic dysfunction of sacral region: Secondary | ICD-10-CM

## 2023-12-09 NOTE — Assessment & Plan Note (Signed)
 Significant degenerative disc disease and nerve conduction study did show the C6 nerve root impingement noted.  I do believe at this point that an epidural in the cervical spine could be beneficial.  Recently did have radiofrequency ablation of the lower back.  Hopeful that this will make significant difference.  We discussed also can consider the possibility of a carpal tunnel injection but it was only mild upon testing.  Will see how patient responds to this.  Responded extremely well to osteopathic manipulation and muscle energy today as another treatment option.  Follow-up with me again 6 weeks after the injection.

## 2023-12-09 NOTE — Patient Instructions (Signed)
 Move appt 1-2 weeks Epidural [phone]

## 2023-12-14 DIAGNOSIS — H5203 Hypermetropia, bilateral: Secondary | ICD-10-CM | POA: Diagnosis not present

## 2023-12-17 ENCOUNTER — Telehealth: Payer: Self-pay

## 2023-12-17 MED ORDER — PREDNISONE 50 MG PO TABS: ORAL_TABLET | ORAL | 0 refills | Status: DC

## 2023-12-20 NOTE — Discharge Instructions (Signed)

## 2023-12-21 ENCOUNTER — Ambulatory Visit
Admission: RE | Admit: 2023-12-21 | Discharge: 2023-12-21 | Disposition: A | Discharge status: Home / Self Care | Payer: Medicare Other | Source: Ambulatory Visit | Attending: Family Medicine | Admitting: Family Medicine

## 2023-12-21 DIAGNOSIS — M5412 Radiculopathy, cervical region: Secondary | ICD-10-CM

## 2023-12-21 MED ORDER — IOPAMIDOL (ISOVUE-M 300) INJECTION 61%
1.0000 mL | Freq: Once | INTRAMUSCULAR | Status: AC
  Administered 2023-12-21: 1 mL via EPIDURAL

## 2023-12-21 MED ORDER — TRIAMCINOLONE ACETONIDE 40 MG/ML IJ SUSP (RADIOLOGY)
60.0000 mg | Freq: Once | INTRAMUSCULAR | Status: AC
  Administered 2023-12-21: 60 mg via EPIDURAL

## 2024-01-04 ENCOUNTER — Ambulatory Visit: Payer: Medicare Other | Admitting: Family Medicine

## 2024-01-11 ENCOUNTER — Other Ambulatory Visit: Payer: Self-pay

## 2024-01-14 NOTE — Progress Notes (Signed)
 [name] [address] [place] [address] Phone: [phone] Subjective:   Bruce Donath, am serving as a scribe for Dr. Antoine Primas.  I'm seeing this patient by the request  of:  Adrian Prince, MD  CC: back and neck pain follow up   [id number]  Mishayla Sliwinski is a 63 y.o. female coming in with complaint of back and neck pain. OMT 12/09/2023. Also f/u for L wrist pain. Patient states that she starts PT tomorrow[facility] Has not [facility] any [facility] yet.   Cervical epidural was helpful.   L wrist ganglion cyst has increased in size.   Medications patient has been prescribed: None  Taking:         Reviewed prior external information including notes and imaging from previsou exam, outside providers and external EMR if available.   As well as notes that were available from care everywhere and other healthcare systems.  Past medical history, social, surgical and family history all reviewed in electronic medical record.  No pertanent information unless stated regarding to the chief complaint.   Past Medical History:  Diagnosis Date   Hypertension     Allergies  Allergen Reactions   Neosporin [Bacitracin-Polymyxin B] Hives and Rash   Adhesive  [Tape] Rash   Contrast Media  [Iodinated Contrast Media] Itching, Dermatitis and Rash     Review of Systems:  No headache, visual changes, nausea, vomiting, diarrhea, constipation, dizziness, abdominal pain, skin rash, fevers, chills, night sweats, weight loss, swollen lymph nodes,  joint swelling, chest pain, shortness of breath, mood changes. POSITIVE muscle aches, body aches  Objective  Blood pressure 110/76, height 5\' 4"  (1.626 m), weight 184 lb (83.5 kg).   General: No apparent distress alert and oriented x3 mood and affect normal, dressed appropriately.  HEENT: Pupils equal, extraocular movements intact  Respiratory: Patient's speak in full sentences and  does not appear short of breath  Cardiovascular: No lower extremity edema, non tender, no erythema  Gait MSK:  Back does have some loss lordosis noted.  Does have tightness with certain range of motion but seems to be more of her neck pain.  Left wrist exam shows that there is a ganglion cyst on the volar aspect of the wrist.  Right near the radial artery.  Procedure: Real-time Ultrasound Guided Injection of ganglion cyst of wrist Device: GE Logiq Q7 Ultrasound guided injection is preferred based studies that show increased duration, increased effect, greater accuracy, decreased procedural pain, increased response rate, and decreased cost with ultrasound guided versus blind injection.  Verbal informed consent obtained.  Time-out conducted.  Noted no overlying erythema, induration, or other signs of local infection.  Skin prepped in a sterile fashion.  Local anesthesia: Topical Ethyl chloride.  With sterile technique and under real time ultrasound guidance: With a 18-gauge 1/2 inch needle injected with 0.5 cc of 0.5% Marcaine and then approach had to be changed but once a day in the cyst had aspiration and gel like fluid.  Then injected 0.5 cc of Kenalog 40 mg/mL Completed without difficulty  Pain immediately resolved suggesting accurate placement of the medication.  Advised to call if fevers/chills, erythema, induration, drainage, or persistent bleeding.  Impression: Technically successful ultrasound guided injection.  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  Unfortunately due to the location near the brachial artery and vein this would be difficult surgical intervention as well.  Hopeful patient will is following.  Because we did have to change  approach did put patient on doxycycline to decrease possible infectious etiology.  Patient though did have improvement in range of motion almost immediately.  Patient would likely bruise.  Patient does work in healthcare so knows what to look for.  Follow-up again in 2 to 3 months  DJD (degenerative joint disease) of cervical spine Discussed the severity of the arthritic changes and to potentially have another injection if necessary.  Discussed posture and ergonomics, discussed which activities to do and which ones to avoid.  Increase slowly.  Follow-up again in 6 to 8 weeks    Nonallopathic problems  Decision today to treat with OMT was based on Physical Exam  After verbal consent patient was treated with HVLA, ME, FPR techniques in cervical, rib, thoracic areas  Patient tolerated the procedure well with improvement in symptoms  Patient given exercises, stretches and lifestyle modifications  See medications in patient instructions if given  Patient will follow up in 4-8 weeks    The above documentation has been reviewed and is accurate and complete Judi Saa, DO          Note: This dictation was prepared with Dragon dictation along with smaller phrase technology. Any transcriptional errors that result from this process are unintentional.

## 2024-01-17 ENCOUNTER — Ambulatory Visit: Payer: Medicare Other | Admitting: Family Medicine

## 2024-01-17 ENCOUNTER — Other Ambulatory Visit: Payer: Self-pay

## 2024-01-17 VITALS — BP 110/76 | Ht 64.0 in | Wt 184.0 lb

## 2024-01-17 DIAGNOSIS — M47812 Spondylosis without myelopathy or radiculopathy, cervical region: Secondary | ICD-10-CM | POA: Diagnosis not present

## 2024-01-17 DIAGNOSIS — M9901 Segmental and somatic dysfunction of cervical region: Secondary | ICD-10-CM | POA: Diagnosis not present

## 2024-01-17 DIAGNOSIS — M67432 Ganglion, left wrist: Secondary | ICD-10-CM | POA: Diagnosis not present

## 2024-01-17 DIAGNOSIS — M9908 Segmental and somatic dysfunction of rib cage: Secondary | ICD-10-CM | POA: Diagnosis not present

## 2024-01-17 DIAGNOSIS — M25532 Pain in left wrist: Secondary | ICD-10-CM

## 2024-01-17 DIAGNOSIS — M9902 Segmental and somatic dysfunction of thoracic region: Secondary | ICD-10-CM

## 2024-01-17 MED ORDER — DOXYCYCLINE HYCLATE 100 MG PO TABS: 100.0000 mg | ORAL_TABLET | Freq: Two times a day (BID) | ORAL | 0 refills | Status: DC

## 2024-01-17 NOTE — Assessment & Plan Note (Signed)
 Recurrent aspiration.  Unfortunately due to the location near the brachial artery and vein this would be difficult surgical intervention as well.  Hopeful patient will is following.  Because we did have to change approach did put patient on doxycycline to decrease possible infectious etiology.  Patient though did have improvement in range of motion almost immediately.  Patient would likely bruise.  Patient does work in healthcare so knows what to look for.  Follow-up again in 2 to 3 months

## 2024-01-17 NOTE — Assessment & Plan Note (Signed)
 Discussed the severity of the arthritic changes and to potentially have another injection if necessary.  Discussed posture and ergonomics, discussed which activities to do and which ones to avoid.  Increase slowly.  Follow-up again in 6 to 8 weeks

## 2024-01-17 NOTE — Patient Instructions (Addendum)
 Aspiration of cyst today Doxy for 7 days If any signs of infection occur such as redness, swelling, heat, or pain, please seek medical attention or go into emergency room for further evaluation See me again in 8 weeks

## 2024-01-18 ENCOUNTER — Other Ambulatory Visit: Payer: Self-pay

## 2024-01-21 ENCOUNTER — Other Ambulatory Visit: Payer: Self-pay

## 2024-01-21 NOTE — Progress Notes (Signed)
 Specialty Pharmacy Refill Coordination Note  Stephanie Hall is a 63 y.o. female contacted today regarding refills of specialty medication(s) OnabotulinumtoxinA (BOTOX)   Patient requested Courier to Provider Office   Delivery date: 01/27/24   Verified address: [facility] [address] [place] [address]   Medication will be filled on 01/26/24.

## 2024-01-24 ENCOUNTER — Other Ambulatory Visit: Payer: Self-pay

## 2024-01-27 ENCOUNTER — Other Ambulatory Visit (HOSPITAL_COMMUNITY): Payer: Self-pay

## 2024-02-01 ENCOUNTER — Other Ambulatory Visit: Payer: Self-pay

## 2024-02-04 ENCOUNTER — Ambulatory Visit: Payer: BC Managed Care – PPO | Admitting: Neurology

## 2024-02-04 DIAGNOSIS — G43009 Migraine without aura, not intractable, without status migrainosus: Secondary | ICD-10-CM | POA: Diagnosis not present

## 2024-02-04 MED ORDER — ONABOTULINUMTOXINA 100 UNITS IJ SOLR
200.0000 [IU] | Freq: Once | INTRAMUSCULAR | Status: AC
  Administered 2024-02-04: 155 [IU] via INTRAMUSCULAR

## 2024-02-04 NOTE — Progress Notes (Signed)

## 2024-02-16 DIAGNOSIS — I10 Essential (primary) hypertension: Secondary | ICD-10-CM | POA: Diagnosis not present

## 2024-02-16 DIAGNOSIS — E785 Hyperlipidemia, unspecified: Secondary | ICD-10-CM | POA: Diagnosis not present

## 2024-02-16 DIAGNOSIS — J849 Interstitial pulmonary disease, unspecified: Secondary | ICD-10-CM | POA: Diagnosis not present

## 2024-02-23 NOTE — Progress Notes (Unsigned)
 [facility] [address] [place] [address] Phone: [phone] Subjective:   Stephanie Hall, am serving as a scribe for Dr. Ronnell Coins.  I'm seeing this patient by the request  of:  Rosslyn Coons, MD  CC: wrist, back, etc   [id number]  01/17/2024 Recurrent aspiration.  Unfortunately due to the location near the brachial artery and vein this would be difficult surgical intervention as well.  Hopeful patient will is following.  Because we did have to change approach did put patient on doxycycline  to decrease possible infectious etiology.  Patient though did have improvement in range of motion almost immediately.  Patient would likely bruise.  Patient does work in healthcare so knows what to look for.  Follow-up again in 2 to 3 months     Discussed the severity of the arthritic changes and to potentially have another injection if necessary.  Discussed posture and ergonomics, discussed which activities to do and which ones to avoid.  Increase slowly.  Follow-up again in 6 to 8 weeks      Update 02/25/2024 Stephanie Hall is a 63 y.o. female coming in with complaint of back and neck pain. OMT 01/17/2024. Also f/u for L wrist pain. Patient states that she continues to have pain in R side of upper thoracic. Pain radiates into her arm and has a decrease in grip.   Also c/o B thumb pain R>L. Cyst is back on L thumb.   Pain across lower back, R>L. Recently her pain worsened recently. Unable to work due to pain. Denies any radiating symptoms.   Medications patient has been prescribed: Doxy      Reviewed prior external information including notes and imaging from previsou exam, outside providers and external EMR if available.   As well as notes that were available from care everywhere and other healthcare systems.  Past medical history, social, surgical and family history all reviewed in electronic medical record.  No pertanent information unless  stated regarding to the chief complaint.   Past Medical History:  Diagnosis Date   Hypertension     Allergies  Allergen Reactions   Neosporin [Bacitracin-Polymyxin B] Hives and Rash   Adhesive  [Tape] Rash   Contrast Media  [Iodinated Contrast Media] Itching, Dermatitis and Rash     Review of Systems:  No headache, visual changes, nausea, vomiting, diarrhea, constipation, dizziness, abdominal pain, skin rash, fevers, chills, night sweats, weight loss, swollen lymph nodes, body aches, joint swelling, chest pain, shortness of breath, mood changes. POSITIVE muscle aches  Objective  Blood pressure 108/70, pulse 76, height 5\' 4"  (1.626 m), SpO2 98%.   General: No apparent distress alert and oriented x3 mood and affect normal, dressed appropriately.  HEENT: Pupils equal, extraocular movements intact  Respiratory: Patient's speak in full sentences and does not appear short of breath  Cardiovascular: No lower extremity edema, non tender, no erythema  MSK:  Back does have some loss lordosis noted.  Significant tightness noted in the parascapular area.  Neck exam does have some tightness noted as well.  Negative Spurling's noted.  Multiple trigger points noted in the right parascapular area including at the level of scapula, trapezius and rhomboid muscle. Right wrist shows a positive Tinel's noted.  Severe tenderness over the [facility] joint with positive grind test noted  Osteopathic findings  C2 flexed rotated and side bent right C6 flexed rotated and side bent right C7 flexed rotated and side bent right T3 extended rotated and side  bent right inhaled rib T9 extended rotated and side bent left L2 flexed rotated and side bent right Sacrum right on right  After verbal consent patient was prepped with alcohol swab and with a 25-gauge half inch needle injected into 4 distinct trigger points in the levator scapula, trapezius, and rhomboid musculature.  Total of 3 cc of 0.5% Marcaine  and 1 cc of  Kenalog  40 mg/mL used.  Minimal blood loss.  Band-Aids placed.  Postinjection instructions given.  Procedure: Real-time Ultrasound Guided Injection of right carpal tunnel Device: GE Logiq Q7  Ultrasound guided injection is preferred based studies that show increased duration, increased effect, greater accuracy, decreased procedural pain, increased response rate with ultrasound guided versus blind injection.  Verbal informed consent obtained.  Time-out conducted.  Noted no overlying erythema, induration, or other signs of local infection.  Skin prepped in a sterile fashion.  Local anesthesia: Topical Ethyl chloride.  With sterile technique and under real time ultrasound guidance:  median nerve visualized.  23g 5/8 inch needle inserted distal to proximal approach into nerve sheath. Pictures taken nfor needle placement. Patient did have injection of  0.5 cc of 0.5% Marcaine , and 0.5 cc of Kenalog  40 mg/dL. Completed without difficulty  Pain immediately resolved suggesting accurate placement of the medication.  Advised to call if fevers/chills, erythema, induration, drainage, or persistent bleeding.  Images sent Impression: Technically successful ultrasound guided injection.  Procedure: Real-time Ultrasound Guided Injection of right CMC joint Device: GE Logiq Q7 Ultrasound guided injection is preferred based studies that show increased duration, increased effect, greater accuracy, decreased procedural pain, increased response rate, and decreased cost with ultrasound guided versus blind injection.  Verbal informed consent obtained.  Time-out conducted.  Noted no overlying erythema, induration, or other signs of local infection.  Skin prepped in a sterile fashion.  Local anesthesia: Topical Ethyl chloride.  With sterile technique and under real time ultrasound guidance: With a 25-gauge half inch needle injected with 0.5 cc of 0.5% Marcaine  and 0.5 cc of Kenalog  40 mg/mL Completed without  difficulty  Pain immediately resolved suggesting accurate placement of the medication.  Advised to call if fevers/chills, erythema, induration, drainage, or persistent bleeding.  Impression: Technically successful ultrasound guided injection.   Assessment and Plan:  Trigger point of right shoulder region Repeat injection given, discussed icing regimen and home exercises, discussed which activities to do and which ones to avoid.  Increase activity slowly.  Follow-up again in 6 to 8 weeks differential includes cervical radiculopathy.  May need to consider repeating injection but will refer to neurosurgery to discuss further evaluation and treatment.  SI (sacroiliac) joint dysfunction Chronic problem with exacerbation noted.  Discussed icing regimen of home exercises, discussed which activities to do and which ones to avoid.  Follow-up again in 6 to 8 weeks.  Arthritis of carpometacarpal (CMC) joint of right thumb Patient given injection and tolerated the procedure well, discussed icing regimen of home exercises, increase activity slowly.  Patient knows that need to do this given for between if possible.  Follow-up with him in 6 to 8 weeks  Right carpal tunnel syndrome Patient given injection and tolerated the procedure well, discussed icing regimen of home exercises, discussed which activities to do and which ones to avoid.  Increase activity slowly.  Follow-up again in 6 to 8 weeks    Nonallopathic problems  Decision today to treat with OMT was based on Physical Exam  After verbal consent patient was treated with HVLA, ME, FPR techniques in cervical,  rib, thoracic, lumbar, and sacral  areas  Patient tolerated the procedure well with improvement in symptoms  Patient given exercises, stretches and lifestyle modifications  See medications in patient instructions if given  Patient will follow up in 4-8 weeks     The above documentation has been reviewed and is accurate and complete  Isidro Margo, DO         Note: This dictation was prepared with Dragon dictation along with smaller phrase technology. Any transcriptional errors that result from this process are unintentional.

## 2024-02-25 ENCOUNTER — Ambulatory Visit: Admitting: Family Medicine

## 2024-02-25 ENCOUNTER — Other Ambulatory Visit: Payer: Self-pay

## 2024-02-25 ENCOUNTER — Encounter: Payer: Self-pay | Admitting: Family Medicine

## 2024-02-25 VITALS — BP 108/70 | HR 76 | Ht 64.0 in

## 2024-02-25 DIAGNOSIS — M533 Sacrococcygeal disorders, not elsewhere classified: Secondary | ICD-10-CM | POA: Diagnosis not present

## 2024-02-25 DIAGNOSIS — M25511 Pain in right shoulder: Secondary | ICD-10-CM

## 2024-02-25 DIAGNOSIS — M1811 Unilateral primary osteoarthritis of first carpometacarpal joint, right hand: Secondary | ICD-10-CM

## 2024-02-25 DIAGNOSIS — M9904 Segmental and somatic dysfunction of sacral region: Secondary | ICD-10-CM | POA: Diagnosis not present

## 2024-02-25 DIAGNOSIS — M9902 Segmental and somatic dysfunction of thoracic region: Secondary | ICD-10-CM

## 2024-02-25 DIAGNOSIS — M9903 Segmental and somatic dysfunction of lumbar region: Secondary | ICD-10-CM

## 2024-02-25 DIAGNOSIS — M9908 Segmental and somatic dysfunction of rib cage: Secondary | ICD-10-CM

## 2024-02-25 DIAGNOSIS — M25532 Pain in left wrist: Secondary | ICD-10-CM | POA: Diagnosis not present

## 2024-02-25 DIAGNOSIS — G5601 Carpal tunnel syndrome, right upper limb: Secondary | ICD-10-CM | POA: Diagnosis not present

## 2024-02-25 DIAGNOSIS — M9901 Segmental and somatic dysfunction of cervical region: Secondary | ICD-10-CM | POA: Diagnosis not present

## 2024-02-25 NOTE — Patient Instructions (Addendum)
 Injected R thumb and R carpal tunnel Injected trigger points in R trap See me in 3 months Write me in 2 weeks and we will refer to [employer]

## 2024-02-25 NOTE — Assessment & Plan Note (Signed)
 Chronic problem with exacerbation noted.  Discussed icing regimen of home exercises, discussed which activities to do and which ones to avoid.  Follow-up again in 6 to 8 weeks.

## 2024-02-25 NOTE — Assessment & Plan Note (Signed)
 Patient given injection and tolerated the procedure well, discussed icing regimen of home exercises, discussed which activities to do and which ones to avoid.  Increase activity slowly.  Follow-up again in 6 to 8 weeks

## 2024-02-25 NOTE — Assessment & Plan Note (Signed)
 Patient given injection and tolerated the procedure well, discussed icing regimen of home exercises, increase activity slowly.  Patient knows that need to do this given for between if possible.  Follow-up with him in 6 to 8 weeks

## 2024-02-25 NOTE — Assessment & Plan Note (Addendum)
 Repeat injection given, discussed icing regimen and home exercises, discussed which activities to do and which ones to avoid.  Increase activity slowly.  Follow-up again in 6 to 8 weeks differential includes cervical radiculopathy.  May need to consider repeating injection but will refer to neurosurgery to discuss further evaluation and treatment.

## 2024-03-02 ENCOUNTER — Ambulatory Visit: Admitting: Family Medicine

## 2024-03-03 ENCOUNTER — Encounter: Payer: Self-pay | Admitting: Family Medicine

## 2024-03-03 DIAGNOSIS — M5412 Radiculopathy, cervical region: Secondary | ICD-10-CM

## 2024-03-03 NOTE — Telephone Encounter (Signed)
 Referral was printed and is waiting for patient to pick it up on Wednesday. It is located up front in the cabinet label for pick up letters.

## 2024-03-07 NOTE — Telephone Encounter (Signed)
 The referral left up front for patient is for Neurology, not Dr. Ellery Guthrie at Neurosurgery. I do not see an actual referral for Dr. Ellery Guthrie.

## 2024-03-22 DIAGNOSIS — M79671 Pain in right foot: Secondary | ICD-10-CM | POA: Diagnosis not present

## 2024-03-27 ENCOUNTER — Ambulatory Visit: Payer: BC Managed Care – PPO | Admitting: Neurology

## 2024-03-29 ENCOUNTER — Other Ambulatory Visit: Payer: Self-pay | Admitting: Neurology

## 2024-03-30 DIAGNOSIS — M25562 Pain in left knee: Secondary | ICD-10-CM | POA: Diagnosis not present

## 2024-03-30 DIAGNOSIS — G8929 Other chronic pain: Secondary | ICD-10-CM | POA: Diagnosis not present

## 2024-03-30 DIAGNOSIS — M1712 Unilateral primary osteoarthritis, left knee: Secondary | ICD-10-CM | POA: Diagnosis not present

## 2024-04-19 DIAGNOSIS — M546 Pain in thoracic spine: Secondary | ICD-10-CM | POA: Diagnosis not present

## 2024-04-19 DIAGNOSIS — M5412 Radiculopathy, cervical region: Secondary | ICD-10-CM | POA: Diagnosis not present

## 2024-04-21 ENCOUNTER — Other Ambulatory Visit: Payer: Self-pay

## 2024-04-21 ENCOUNTER — Other Ambulatory Visit (HOSPITAL_COMMUNITY): Payer: Self-pay

## 2024-04-21 NOTE — Progress Notes (Signed)
 Specialty Pharmacy Refill Coordination Note  Clear Bag Patient  Stephanie Hall is a 63 y.o. female contacted today regarding refills of specialty medication(s) OnabotulinumtoxinA  (BOTOX )  Injection appointment: 05/02/24   Patient requested: Courier to Provider Office   Delivery date: 04/27/24   Verified address: [facility] [address] [place] [address]  Medication will be filled on 04/26/24.

## 2024-04-24 DIAGNOSIS — M1811 Unilateral primary osteoarthritis of first carpometacarpal joint, right hand: Secondary | ICD-10-CM | POA: Diagnosis not present

## 2024-04-24 DIAGNOSIS — M19041 Primary osteoarthritis, right hand: Secondary | ICD-10-CM | POA: Diagnosis not present

## 2024-04-24 DIAGNOSIS — M65932 Unspecified synovitis and tenosynovitis, left forearm: Secondary | ICD-10-CM | POA: Diagnosis not present

## 2024-04-24 DIAGNOSIS — M19042 Primary osteoarthritis, left hand: Secondary | ICD-10-CM | POA: Diagnosis not present

## 2024-04-25 ENCOUNTER — Other Ambulatory Visit: Payer: Self-pay

## 2024-04-26 ENCOUNTER — Other Ambulatory Visit: Payer: Self-pay

## 2024-05-05 ENCOUNTER — Ambulatory Visit: Admitting: Neurology

## 2024-05-19 ENCOUNTER — Ambulatory Visit: Admitting: Neurology

## 2024-05-19 DIAGNOSIS — G43709 Chronic migraine without aura, not intractable, without status migrainosus: Secondary | ICD-10-CM

## 2024-05-19 MED ORDER — ONABOTULINUMTOXINA 100 UNITS IJ SOLR
200.0000 [IU] | Freq: Once | INTRAMUSCULAR | Status: AC
  Administered 2024-05-19: 155 [IU] via INTRAMUSCULAR

## 2024-05-19 NOTE — Progress Notes (Signed)

## 2024-05-22 DIAGNOSIS — M545 Low back pain, unspecified: Secondary | ICD-10-CM | POA: Diagnosis not present

## 2024-05-22 DIAGNOSIS — M542 Cervicalgia: Secondary | ICD-10-CM | POA: Diagnosis not present

## 2024-05-23 NOTE — Progress Notes (Deleted)
  [facility] [address] [place] [address] Phone: [phone] Subjective:    I'm seeing this patient by the request  of:  Nichole Senior, MD  CC:   YEP:[id number]  Stephanie Hall is a 63 y.o. female coming in with complaint of back and neck pain. OMT 02/25/2024. Also f/u for R shoulder and R CMC jt arthritis. Patient states   Medications patient has been prescribed: None  Taking:         Reviewed prior external information including notes and imaging from previsou exam, outside providers and external EMR if available.   As well as notes that were available from care everywhere and other healthcare systems.  Past medical history, social, surgical and family history all reviewed in electronic medical record.  No pertanent information unless stated regarding to the chief complaint.   Past Medical History:  Diagnosis Date   Hypertension     Allergies  Allergen Reactions   Neosporin [Bacitracin-Polymyxin B] Hives and Rash   Adhesive  [Tape] Rash   Contrast Media  [Iodinated Contrast Media] Itching, Dermatitis and Rash     Review of Systems:  No headache, visual changes, nausea, vomiting, diarrhea, constipation, dizziness, abdominal pain, skin rash, fevers, chills, night sweats, weight loss, swollen lymph nodes, body aches, joint swelling, chest pain, shortness of breath, mood changes. POSITIVE muscle aches  Objective  There were no vitals taken for this visit.   General: No apparent distress alert and oriented x3 mood and affect normal, dressed appropriately.  HEENT: Pupils equal, extraocular movements intact  Respiratory: Patient's speak in full sentences and does not appear short of breath  Cardiovascular: No lower extremity edema, non tender, no erythema  Gait MSK:  Back   Osteopathic findings  C2 flexed rotated and side bent right C6 flexed rotated and side bent left T3 extended rotated and side bent right inhaled  rib T9 extended rotated and side bent left L2 flexed rotated and side bent right Sacrum right on right       Assessment and Plan:  No problem-specific Assessment & Plan notes found for this encounter.    Nonallopathic problems  Decision today to treat with OMT was based on Physical Exam  After verbal consent patient was treated with HVLA, ME, FPR techniques in cervical, rib, thoracic, lumbar, and sacral  areas  Patient tolerated the procedure well with improvement in symptoms  Patient given exercises, stretches and lifestyle modifications  See medications in patient instructions if given  Patient will follow up in 4-8 weeks             Note: This dictation was prepared with Dragon dictation along with smaller phrase technology. Any transcriptional errors that result from this process are unintentional.

## 2024-05-26 ENCOUNTER — Other Ambulatory Visit: Payer: Self-pay | Admitting: Family Medicine

## 2024-05-26 DIAGNOSIS — M545 Low back pain, unspecified: Secondary | ICD-10-CM

## 2024-05-26 DIAGNOSIS — M5412 Radiculopathy, cervical region: Secondary | ICD-10-CM

## 2024-05-30 ENCOUNTER — Ambulatory Visit: Admitting: Family Medicine

## 2024-05-30 ENCOUNTER — Ambulatory Visit: Payer: BC Managed Care – PPO | Admitting: Neurology

## 2024-06-05 ENCOUNTER — Other Ambulatory Visit: Payer: Self-pay | Admitting: Neurology

## 2024-06-07 ENCOUNTER — Ambulatory Visit
Admission: RE | Admit: 2024-06-07 | Discharge: 2024-06-07 | Disposition: A | Discharge status: Home / Self Care | Payer: Worker's Compensation | Source: Ambulatory Visit | Attending: Family Medicine | Admitting: Family Medicine

## 2024-06-07 ENCOUNTER — Inpatient Hospital Stay
Admission: RE | Admit: 2024-06-07 | Discharge: 2024-06-07 | Discharge status: Home / Self Care | Payer: Self-pay | Source: Ambulatory Visit | Attending: Family Medicine | Admitting: Family Medicine

## 2024-06-07 DIAGNOSIS — M5412 Radiculopathy, cervical region: Secondary | ICD-10-CM

## 2024-06-07 DIAGNOSIS — M545 Low back pain, unspecified: Secondary | ICD-10-CM

## 2024-06-21 DIAGNOSIS — K080 Exfoliation of teeth due to systemic causes: Secondary | ICD-10-CM | POA: Diagnosis not present

## 2024-07-03 ENCOUNTER — Ambulatory Visit: Admitting: Family Medicine

## 2024-07-11 DIAGNOSIS — J324 Chronic pansinusitis: Secondary | ICD-10-CM | POA: Diagnosis not present

## 2024-07-11 DIAGNOSIS — Z9889 Other specified postprocedural states: Secondary | ICD-10-CM | POA: Diagnosis not present

## 2024-07-11 DIAGNOSIS — J301 Allergic rhinitis due to pollen: Secondary | ICD-10-CM | POA: Diagnosis not present

## 2024-07-11 DIAGNOSIS — B49 Unspecified mycosis: Secondary | ICD-10-CM | POA: Diagnosis not present

## 2024-07-11 DIAGNOSIS — J329 Chronic sinusitis, unspecified: Secondary | ICD-10-CM | POA: Diagnosis not present

## 2024-07-11 DIAGNOSIS — R0989 Other specified symptoms and signs involving the circulatory and respiratory systems: Secondary | ICD-10-CM | POA: Diagnosis not present

## 2024-07-27 ENCOUNTER — Ambulatory Visit: Admitting: Neurology

## 2024-07-27 VITALS — BP 115/77 | HR 85 | Ht 64.0 in | Wt 179.0 lb

## 2024-07-27 DIAGNOSIS — G43109 Migraine with aura, not intractable, without status migrainosus: Secondary | ICD-10-CM | POA: Diagnosis not present

## 2024-07-27 DIAGNOSIS — G43009 Migraine without aura, not intractable, without status migrainosus: Secondary | ICD-10-CM

## 2024-07-27 DIAGNOSIS — G501 Atypical facial pain: Secondary | ICD-10-CM | POA: Diagnosis not present

## 2024-07-27 DIAGNOSIS — M47812 Spondylosis without myelopathy or radiculopathy, cervical region: Secondary | ICD-10-CM | POA: Diagnosis not present

## 2024-07-27 NOTE — Progress Notes (Signed)
 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  Headache rescue:  rizatriptan .  Baclofen  or methocarbamol Limit use of pain relievers to no more than 2 days out of week to prevent risk of rebound or medication-overuse headache. Keep headache diary Sees Dr. Claudene of Sports Medicine for neck pain/cervical radiculopathy Follow up for routine visit in 6 onths.     Subjective:  Stephanie Hall is a 63 year old right-handed white female with hypertension who follows up for vertigo and migraine  UPDATE: On Botox   Classic migraines: Stable Intensity:  moderate Duration: several hours with rizatriptan  and methocarbamol Frequency:  4 a month Primarily triggered by her neck pain.  MRI of C-spine on 06/07/2024 revealed degenerative spondylosis at C5-6 with right worse than left C6 foraminal stenosis and moderate multilevel facet hypertrophy and reactive marrow edema involving the right C6-7 facet.  Plan is for surgery.  Ocular migraines: Usually 1 every several weeks.  For the past month, they have been once a week but has had 3 in the last week, lasting 10-15 minutes.  Atypical facial pain/trigeminal neuralgia: Had a recurrence 3 weeks ago (burning and tenderness in the left V2-V3 distribution and shooting pain in left temple) lasting for 4 days.  Vestibular migraine: One brief spell since last visit.     Current NSAIDs/analgesics:  Excedrin (rarely), tramadol (back pain), Celebrex Current triptan:  rizatriptan  10mg  Current muscle relaxant: baclofen  10mg , methocarbamol Current blood pressure medication:  metoprolol , hydrochlorothiazide Current antidepressant: none Current antiepileptic  medication:  gabapentin  300mg  twice daily Other treatment:  Botox     HISTORY: She works as an Charity fundraiser at [employer].  On 10/12/2019, she was at work walking down the hall when she suddenly felt dizzy.  She noted spinning vertigo and headache.  She went home but the vertigo became worse and she had nausea and vomiting.  She returned to the ED for evaluation.  She was given meclizine  and Zofran  which were ineffective.  Initial head CT was normal.  She was transported to [facility] for an MRI.  MRI brain with and without contrast were normal. Labs and EKG revealed no cardiac abnormality, anemia or electrolyte imbalance.  She was diagnosed with peripheral vertigo.  She felt wobbly for the next 3 days.  Then on 10/21/2019, she woke up with severe pounding and squeezing bilateral retro-orbital headache as well as nausea and feeling unsteady but no vertigo.  Symptoms subsided.  She had a recurrence on 10/25/2019 and symptoms have persisted.  She reports that her eyes feel like they are floating in syrup.  She reports some blurred vision, particularly with distance and her reading glasses don't work but she denies double vision and visual obscurations.  She has tinnitus but denies pulsatile tinnitus.  She reports right sided aural fullness.  Dizziness is aggravated by riding in a car.  She feels unsteady on her feet.  She finds it a little difficult to speak, like her tongue is swollen, as well as some word-finding difficulty.  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  She was placed on acetazolamide 500mg  twice daily and was started on a course of prednisone  and has Valium  on-hand.  I spoke with patient's ophthalmologist, Dr. Jarold, who in  retrospect wasn't convinced that she definitely had papilledema on exam.  I still wanted to proceed with workup with MRA/MRV of head and lumbar puncture.  However, thinking that her vertigo may be migraine, I  wanted her to switch from acetazolamide to topiramate  25mg  twice daily.  MRA of head on 11/20/2019 showed possible moderate proximal left M2 stenosis but. follow up CTA of head on 11/30/2019 confirmed it was artifact.  MRV of head on 11/23/2019 showed no stenosis or thrombosis.  Lumbar punctre on 11/24/2019 demonstrated normal opening pressure of 16 cm water.   History of headaches all of her life described as severe pressure-like occipital pain radiating down the spine.     Typically wakes up with headaches and occurring at end of day as well.  They occur 3 times a week.  Takes Robaxin and Excedrin Migraine.  Sometimes not effective.  Also gets bi-frontal/temporal tension headaches.  They occur 2-3 times a week.  Also treats with Robaxin and Excedrin.  If headache more severe and associated with nausea, photophobia and phonophobia, will take rizatriptan  with variable efficacy.  These headaches occur 1-2 times a week.  Takes Robaxin 3-4 days a week.  Takes Excedrin 3 days a week. Triggers include alcohol and neck pain.    She had been seeing Dr. Claudene of Sports Medicine for her neck pain.  Has been treated with OMT, trigger point injections.  OMT helps.  Trigger point injections ineffective.  MRI of cervical spine revealed degenerative spine disease contributing to mild spinal stenosis and moderate left/severe right C6 neural foraminal stenosis at C5-6, moderate-severe right C4 neural foraminal stenosis. Also endorsed bilateral wrist pain.  Had NCV-EMG of upper extremities on 11/25/2023 showed findings of mild chronic right C5-C6 radiculopathy and mild bilateral carpal tunnel syndrome.  Following up with Dr. Claudene.   Takes gabapentin  - recently increased from 200mg  to 300mg  at bedtime.     She also has history of ocular migraines presenting as scintillating scotoma lasting 20 minutes.  Every couple of months, she gets a left facial neuralgia - sharp twinge in the temple and a mild burning discomfort in the  V1-V2 distribution.  May last a day.  It comes and goes since her 33s.  Saw neurology at that time.  Sed rate was unremarkable.     Past NSAIDs/analgesics:  meloxicam Past antiepileptic:  topiramate  (cognitive deficits) Past antidepressant:  Cymbalta, Lexapro Past antiepileptic:  none Past CGRP inhibitor:  Aimovig  140mg   PAST MEDICAL HISTORY: Past Medical History:  Diagnosis Date   Hypertension     MEDICATIONS: Current Outpatient Medications on File Prior to Visit  Medication Sig Dispense Refill   ALPRAZolam (XANAX) 0.5 MG tablet Take 0.5 mg by mouth as needed.     baclofen  (LIORESAL ) 10 MG tablet Take 1 tablet (10 mg total) by mouth 3 (three) times daily as needed for muscle spasms. 90 each 5   botulinum toxin Type A  (BOTOX ) 200 units injection Inject 200 Units into the muscle every 3 (three) months. Inject 155 units IM into multiple site in the face,neck and head once every 90 days 1 each 4   hydrochlorothiazide (HYDRODIURIL) 25 MG tablet Take 25 mg by mouth daily.     LEXAPRO 10 MG tablet Take 10 mg by mouth daily.     metoprolol  succinate (TOPROL -XL) 50 MG 24 hr tablet Take 1 tablet by mouth 2 (two) times daily.     omeprazole (PRILOSEC) 40 MG capsule Take 1 capsule by mouth daily.  OVER THE COUNTER MEDICATION Multivitamin-Take 1 table by mouth daily.     REPATHA SURECLICK 140 MG/ML SOAJ Inject 140 mg into the skin every 14 (fourteen) days.     rizatriptan  (MAXALT -MLT) 10 MG disintegrating tablet TAKE AS DIRECTED 9 tablet 1   ULTRAM 50 MG tablet Take 50 mg by mouth as needed.     No current facility-administered medications on file prior to visit.    ALLERGIES: Allergies  Allergen Reactions   Neosporin [Bacitracin-Polymyxin B] Hives and Rash   Adhesive  [Tape] Rash   Contrast Media  [Iodinated Contrast Media] Itching, Dermatitis and Rash    FAMILY HISTORY: No family history on file.    Objective:  Blood pressure 115/77, pulse 85, height 5' 4 (1.626 m), weight 179  lb (81.2 kg), SpO2 97%. General: No acute distress.  Patient appears well-groomed.   Head:  Normocephalic/atraumatic Eyes:  Fundi examined but not visualized Neck: supple, no paraspinal tenderness, full range of motion Heart:  Regular rate and rhythm Neurological Exam: alert and oriented.  Speech fluent and not dysarthric, language intact.  CN II-XII intact. Bulk and tone normal, muscle strength 5/5 throughout.  Sensation to light touch intact.  Deep tendon reflexes 2+ throughout, toes downgoing.  Finger to nose testing intact.  Gait normal, Romberg negative.   Juliene Dunnings, DO  CC: Garnette Ore, MD

## 2024-08-04 ENCOUNTER — Ambulatory Visit: Admitting: Neurology

## 2024-08-04 ENCOUNTER — Other Ambulatory Visit (HOSPITAL_COMMUNITY): Payer: Self-pay

## 2024-08-07 ENCOUNTER — Other Ambulatory Visit: Payer: Self-pay

## 2024-08-07 NOTE — Progress Notes (Signed)
 Specialty Pharmacy Refill Coordination Note  Stephanie Hall is a 62 y.o. female assessed today regarding refills of clinic administered specialty medication(s) OnabotulinumtoxinA  (BOTOX )   Clinic requested Courier to Provider Office   Delivery date: 08/09/24   Verified address: [facility] [address] [place] [address]   Medication will be filled on: 08/08/24

## 2024-08-11 ENCOUNTER — Ambulatory Visit: Admitting: Neurology

## 2024-08-11 DIAGNOSIS — H903 Sensorineural hearing loss, bilateral: Secondary | ICD-10-CM | POA: Diagnosis not present

## 2024-08-11 DIAGNOSIS — Z0110 Encounter for examination of ears and hearing without abnormal findings: Secondary | ICD-10-CM | POA: Diagnosis not present

## 2024-08-11 DIAGNOSIS — H9313 Tinnitus, bilateral: Secondary | ICD-10-CM | POA: Diagnosis not present

## 2024-08-11 DIAGNOSIS — G43809 Other migraine, not intractable, without status migrainosus: Secondary | ICD-10-CM | POA: Diagnosis not present

## 2024-08-11 DIAGNOSIS — H938X1 Other specified disorders of right ear: Secondary | ICD-10-CM | POA: Diagnosis not present

## 2024-08-11 DIAGNOSIS — G43119 Migraine with aura, intractable, without status migrainosus: Secondary | ICD-10-CM | POA: Diagnosis not present

## 2024-08-11 DIAGNOSIS — Z974 Presence of external hearing-aid: Secondary | ICD-10-CM | POA: Diagnosis not present

## 2024-08-14 DIAGNOSIS — D229 Melanocytic nevi, unspecified: Secondary | ICD-10-CM | POA: Diagnosis not present

## 2024-08-14 DIAGNOSIS — L814 Other melanin hyperpigmentation: Secondary | ICD-10-CM | POA: Diagnosis not present

## 2024-08-14 DIAGNOSIS — L820 Inflamed seborrheic keratosis: Secondary | ICD-10-CM | POA: Diagnosis not present

## 2024-08-14 DIAGNOSIS — L821 Other seborrheic keratosis: Secondary | ICD-10-CM | POA: Diagnosis not present

## 2024-08-18 ENCOUNTER — Ambulatory Visit: Admitting: Neurology

## 2024-08-18 DIAGNOSIS — G43009 Migraine without aura, not intractable, without status migrainosus: Secondary | ICD-10-CM | POA: Diagnosis not present

## 2024-08-18 MED ORDER — ONABOTULINUMTOXINA 100 UNITS IJ SOLR
200.0000 [IU] | Freq: Once | INTRAMUSCULAR | Status: AC
  Administered 2024-08-18: 155 [IU] via INTRAMUSCULAR

## 2024-08-18 NOTE — Progress Notes (Signed)

## 2024-08-22 DIAGNOSIS — M1811 Unilateral primary osteoarthritis of first carpometacarpal joint, right hand: Secondary | ICD-10-CM | POA: Diagnosis not present

## 2024-08-23 DIAGNOSIS — M542 Cervicalgia: Secondary | ICD-10-CM | POA: Diagnosis not present

## 2024-08-29 DIAGNOSIS — Z0000 Encounter for general adult medical examination without abnormal findings: Secondary | ICD-10-CM | POA: Diagnosis not present

## 2024-08-29 DIAGNOSIS — Z1212 Encounter for screening for malignant neoplasm of rectum: Secondary | ICD-10-CM | POA: Diagnosis not present

## 2024-08-29 DIAGNOSIS — Z23 Encounter for immunization: Secondary | ICD-10-CM | POA: Diagnosis not present

## 2024-08-29 DIAGNOSIS — R82998 Other abnormal findings in urine: Secondary | ICD-10-CM | POA: Diagnosis not present

## 2024-08-29 DIAGNOSIS — I10 Essential (primary) hypertension: Secondary | ICD-10-CM | POA: Diagnosis not present

## 2024-09-21 DIAGNOSIS — G5602 Carpal tunnel syndrome, left upper limb: Secondary | ICD-10-CM | POA: Diagnosis not present

## 2024-09-21 DIAGNOSIS — M1811 Unilateral primary osteoarthritis of first carpometacarpal joint, right hand: Secondary | ICD-10-CM | POA: Diagnosis not present

## 2024-09-21 DIAGNOSIS — G5601 Carpal tunnel syndrome, right upper limb: Secondary | ICD-10-CM | POA: Diagnosis not present

## 2024-10-17 ENCOUNTER — Ambulatory Visit
Admission: RE | Admit: 2024-10-17 | Discharge: 2024-10-17 | Disposition: A | Source: Ambulatory Visit | Attending: Emergency Medicine | Admitting: Emergency Medicine

## 2024-10-17 DIAGNOSIS — J849 Interstitial pulmonary disease, unspecified: Secondary | ICD-10-CM

## 2024-10-27 ENCOUNTER — Ambulatory Visit: Payer: Self-pay | Admitting: Emergency Medicine

## 2024-10-30 ENCOUNTER — Other Ambulatory Visit: Payer: Self-pay | Admitting: Neurology

## 2024-10-30 ENCOUNTER — Other Ambulatory Visit: Payer: Self-pay

## 2024-10-30 DIAGNOSIS — G43709 Chronic migraine without aura, not intractable, without status migrainosus: Secondary | ICD-10-CM

## 2024-11-01 ENCOUNTER — Other Ambulatory Visit (HOSPITAL_COMMUNITY): Payer: Self-pay

## 2024-11-01 ENCOUNTER — Telehealth: Payer: Self-pay | Admitting: Pharmacy Technician

## 2024-11-01 ENCOUNTER — Other Ambulatory Visit: Payer: Self-pay

## 2024-11-01 NOTE — Telephone Encounter (Signed)
 Pharmacy Patient Advocate Encounter   Received notification from RX Request Messages that prior authorization for BOTOX  200 is required/requested.   Insurance verification completed.   The patient is insured through [facility].   Per test claim: PA required; PA submitted to above mentioned insurance via Latent Key/confirmation #/EOC [id number] Status is pending

## 2024-11-01 NOTE — Telephone Encounter (Signed)
 Pharmacy Patient Advocate Encounter  Received notification from [facility] that Prior Authorization for BOTOX  200 has been APPROVED from 1.21.26 to 1.21.27. Ran test claim, Copay is $385.05. This test claim was processed through [facility]- copay amounts may vary at other pharmacies due to pharmacy/plan contracts, or as the patient moves through the different stages of their insurance plan.   PA #/Case ID/Reference #: [id number]

## 2024-11-01 NOTE — Telephone Encounter (Signed)
 PA has been submitted, and telephone encounter has been created. Please see telephone encounter dated 1.21.26.

## 2024-11-02 ENCOUNTER — Other Ambulatory Visit: Payer: Self-pay

## 2024-11-02 ENCOUNTER — Other Ambulatory Visit (HOSPITAL_COMMUNITY): Payer: Self-pay

## 2024-11-02 MED ORDER — BOTOX 200 UNITS IJ SOLR
200.0000 [IU] | INTRAMUSCULAR | 4 refills | Status: AC
  Filled 2024-11-02 – 2024-11-13 (×2): qty 1, 90d supply, fill #0

## 2024-11-08 ENCOUNTER — Telehealth (HOSPITAL_BASED_OUTPATIENT_CLINIC_OR_DEPARTMENT_OTHER): Payer: Self-pay | Admitting: Emergency Medicine

## 2024-11-08 ENCOUNTER — Encounter (HOSPITAL_BASED_OUTPATIENT_CLINIC_OR_DEPARTMENT_OTHER)

## 2024-11-08 ENCOUNTER — Encounter (HOSPITAL_BASED_OUTPATIENT_CLINIC_OR_DEPARTMENT_OTHER): Payer: Self-pay

## 2024-11-08 ENCOUNTER — Ambulatory Visit: Admitting: Emergency Medicine

## 2024-11-08 DIAGNOSIS — J849 Interstitial pulmonary disease, unspecified: Secondary | ICD-10-CM

## 2024-11-09 ENCOUNTER — Ambulatory Visit

## 2024-11-09 ENCOUNTER — Ambulatory Visit: Admitting: Emergency Medicine

## 2024-11-09 ENCOUNTER — Encounter: Payer: Self-pay | Admitting: Emergency Medicine

## 2024-11-09 VITALS — BP 108/68 | HR 90 | Temp 97.8°F | Ht 64.0 in | Wt 157.2 lb

## 2024-11-09 DIAGNOSIS — J849 Interstitial pulmonary disease, unspecified: Secondary | ICD-10-CM

## 2024-11-09 DIAGNOSIS — J984 Other disorders of lung: Secondary | ICD-10-CM

## 2024-11-09 DIAGNOSIS — G478 Other sleep disorders: Secondary | ICD-10-CM

## 2024-11-09 LAB — PULMONARY FUNCTION TEST
DL/VA % pred: 99 %
DL/VA: 4.15 ml/min/mmHg/L
DLCO cor % pred: 87 %
DLCO cor: 17.29 ml/min/mmHg
DLCO unc % pred: 91 %
DLCO unc: 17.99 ml/min/mmHg
FEF 25-75 Post: 3.67 L/s
FEF 25-75 Pre: 3.84 L/s
FEF2575-%Change-Post: -4 %
FEF2575-%Pred-Post: 176 %
FEF2575-%Pred-Pre: 184 %
FEV1-%Change-Post: 0 %
FEV1-%Pred-Post: 110 %
FEV1-%Pred-Pre: 110 %
FEV1-Post: 2.64 L
FEV1-Pre: 2.63 L
FEV1FVC-%Change-Post: 0 %
FEV1FVC-%Pred-Pre: 114 %
FEV6-%Change-Post: 0 %
FEV6-%Pred-Post: 99 %
FEV6-%Pred-Pre: 99 %
FEV6-Post: 3 L
FEV6-Pre: 3 L
FEV6FVC-%Pred-Post: 104 %
FEV6FVC-%Pred-Pre: 104 %
FVC-%Change-Post: 0 %
FVC-%Pred-Post: 95 %
FVC-%Pred-Pre: 95 %
FVC-Post: 3 L
FVC-Pre: 3 L
Post FEV1/FVC ratio: 88 %
Post FEV6/FVC ratio: 100 %
Pre FEV1/FVC ratio: 88 %
Pre FEV6/FVC Ratio: 100 %
RV % pred: 93 %
RV: 1.98 L
TLC % pred: 97 %
TLC: 4.95 L

## 2024-11-09 NOTE — Progress Notes (Signed)
 Full pft performed today

## 2024-11-09 NOTE — Progress Notes (Signed)
 "  Subjective:    Patient ID: Stephanie Hall, female    DOB: 01/29/1961, 64 y.o.   MRN: [id number]  HPI  ROV 11/18/23 --follow-up visit for 64 year old woman with a history of chronic cough and mild interstitial lung disease noted on chest imaging.  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  Pulmonary function testing with evidence for possible mild restriction.  Her autoimmune evaluation is negative.  She had a repeat high-res CT chest as below.  She deals with chronic cough, has some allergic rhinitis and chronic reflux.  Has been managed with omeprazole 40 mg..  Today she reports that she continues to have dry cough, no real change compared with priors.  This happens to her especially at night, non-productive. She can sometimes feel reflux. She has a lot of congestion, uses zyrtec at bedtime, flonase every day.   High-resolution CT scan of the chest done 11/03/2023 and reviewed by me, shows some mild to moderate patchy air trapping bilaterally without definite tracheobronchomalacia.  Some mild patchy peripheral reticulation and some associated traction bronchiectasis without frank honeycomb change.  May be slightly better than her CT chest from 10/27/2022.  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  ROV 11/09/2024 --pleasant 64 year old woman whom I have followed for interstitial disease noted on chest imaging and a predominantly NSIP pattern, associated mild restriction on pulmonary function testing.  She has a negative autoimmune panel.  She deals with chronic reflux and allergic rhinitis with some associated chronic cough (on Zyrtec, Flonase, omeprazole).  We repeated PFT and chest imaging as below.  High-resolution CT scan of the chest done 10/17/2024 reviewed by me, shows mild base predominant subpleural ground glass and associated reticulation with some bronchiolectasis.  Otherwise clear.  Possible mild air trapping.  No significant change compared with 11/03/2023.  Not  in a UIP pattern.  Pulmonary function testing shows normal airflows without a bronchodilator response the FEV1/FVC ratio is 88% which could suggest a component of restriction.  The lung volumes are normal.  Diffusion capacity normal.  The FEV1 and FVC have both improved compared with 1 year ago.  The TLC is also improved.  Labs 11/05/2022: Negative anti-Smith, weak 1:40 ANA, negative ANCA, negative anti-smooth muscle, negative RF, negative Scl-70, negative anti-Jo, negative dsDNA, normal aldolase Labs 05/25/2022: Weak ANA 1:40, ACE level 37, CCP negative   Review of Systems As per HPI     Objective:   Physical Exam Vitals:   11/09/24 1339  BP: 108/68  Pulse: 90  Temp: 97.8 F (36.6 C)  SpO2: 98%  Weight: 157 lb 4 oz (71.3 kg)  Height: 5' 4 (1.626 m)   Gen: Pleasant, well-nourished, in no distress,  normal affect  ENT: No lesions,  mouth clear,  oropharynx clear, no postnasal drip  Neck: No JVD, no stridor  Lungs: No use of accessory muscles, no crackles or wheezing on normal respiration, no wheeze on forced expiration  Cardiovascular: RRR, heart sounds normal, no murmur or gallops, no peripheral edema  Musculoskeletal: No deformities, no cyanosis or clubbing  Neuro: alert, awake, non focal  Skin: Warm, no lesions or rash      Assessment & Plan:  ILD (interstitial lung disease) (HCC) We reviewed your high-resolution CT scan of the chest today.  This is stable compared with your priors.  Good news. We also reviewed your pulmonary function testing.  These have improved compared with your prior and following to the normal range.  Good news.  Dr. Shelah will review your case with Dr. Geronimo in the interstitial lung disease clinic. We will repeat your CT scan of the chest in January 2027 to follow for interval stability. Please notify us  if you have any change in your breathing or any new respiratory symptoms. Please follow Dr. Shelah in January 2027.  Call if you have any  problems we can see you sooner.  Upper airway resistance syndrome Not intrusive at this time.    I personally spent a total of 30 minutes in the care of the patient today including preparing to see the patient, getting/reviewing separately obtained history, performing a medically appropriate exam/evaluation, counseling and educating, placing orders, referring and communicating with other health care professionals, documenting clinical information in the EHR, independently interpreting results, and communicating results.   Lamar Shelah, MD, PhD 11/09/2024, 2:14 PM [facility] [phone] or if no answer before 7:00PM call [phone] For any issues after 7:00PM please call eLink [phone]   "

## 2024-11-09 NOTE — Assessment & Plan Note (Signed)
 Not intrusive at this time.

## 2024-11-09 NOTE — Patient Instructions (Signed)
 Full pft performed today

## 2024-11-09 NOTE — Assessment & Plan Note (Signed)
 We reviewed your high-resolution CT scan of the chest today.  This is stable compared with your priors.  Good news. We also reviewed your pulmonary function testing.  These have improved compared with your prior and following to the normal range.  Good news. Dr. Shelah will review your case with Dr. Geronimo in the interstitial lung disease clinic. We will repeat your CT scan of the chest in January 2027 to follow for interval stability. Please notify us  if you have any change in your breathing or any new respiratory symptoms. Please follow Dr. Shelah in January 2027.  Call if you have any problems we can see you sooner.

## 2024-11-09 NOTE — Patient Instructions (Signed)
 We reviewed your high-resolution CT scan of the chest today.  This is stable compared with your priors.  Good news. We also reviewed your pulmonary function testing.  These have improved compared with your prior and following to the normal range.  Good news. Dr. Shelah will review your case with Dr. Geronimo in the interstitial lung disease clinic. We will repeat your CT scan of the chest in January 2027 to follow for interval stability. Please notify us  if you have any change in your breathing or any new respiratory symptoms. Please follow Dr. Shelah in January 2027.  Call if you have any problems we can see you sooner.

## 2024-11-13 ENCOUNTER — Other Ambulatory Visit (HOSPITAL_COMMUNITY): Payer: Self-pay

## 2024-11-15 ENCOUNTER — Other Ambulatory Visit (HOSPITAL_COMMUNITY): Payer: Self-pay

## 2024-11-17 ENCOUNTER — Ambulatory Visit: Admitting: Emergency Medicine

## 2024-11-17 ENCOUNTER — Other Ambulatory Visit: Payer: Self-pay

## 2024-11-17 ENCOUNTER — Other Ambulatory Visit (HOSPITAL_COMMUNITY): Payer: Self-pay

## 2024-11-17 NOTE — Progress Notes (Signed)
 This would be correct. Pt is Medicare eligible, therefore no longer eligible for the Botox  saving card.

## 2024-11-24 ENCOUNTER — Ambulatory Visit: Admitting: Neurology
